# Patient Record
Sex: Female | Born: 1960
Health system: Southern US, Community
[De-identification: ages and names within clinical notes are randomized; demographics above are authoritative.]

## PROBLEM LIST (undated history)

## (undated) DIAGNOSIS — N809 Endometriosis, unspecified: Secondary | ICD-10-CM

## (undated) DIAGNOSIS — R002 Palpitations: Secondary | ICD-10-CM

## (undated) DIAGNOSIS — R42 Dizziness and giddiness: Secondary | ICD-10-CM

## (undated) DIAGNOSIS — J45909 Unspecified asthma, uncomplicated: Secondary | ICD-10-CM

## (undated) DIAGNOSIS — I10 Essential (primary) hypertension: Secondary | ICD-10-CM

## (undated) DIAGNOSIS — F419 Anxiety disorder, unspecified: Secondary | ICD-10-CM

## (undated) DIAGNOSIS — N951 Menopausal and female climacteric states: Secondary | ICD-10-CM

## (undated) DIAGNOSIS — Z87442 Personal history of urinary calculi: Secondary | ICD-10-CM

## (undated) DIAGNOSIS — D219 Benign neoplasm of connective and other soft tissue, unspecified: Secondary | ICD-10-CM

## (undated) HISTORY — DX: Essential (primary) hypertension: I10

## (undated) HISTORY — DX: Endometriosis, unspecified: N80.9

## (undated) HISTORY — DX: Anxiety disorder, unspecified: F41.9

## (undated) HISTORY — DX: Menopausal and female climacteric states: N95.1

## (undated) HISTORY — DX: Palpitations: R00.2

## (undated) HISTORY — DX: Benign neoplasm of connective and other soft tissue, unspecified: D21.9

## (undated) HISTORY — DX: Unspecified asthma, uncomplicated: J45.909

## (undated) HISTORY — PX: PELVIC LAPAROSCOPY: SHX162

---

## 2011-09-22 HISTORY — PX: ABDOMINAL HYSTERECTOMY: SHX81

## 2015-03-06 DIAGNOSIS — H8110 Benign paroxysmal vertigo, unspecified ear: Secondary | ICD-10-CM | POA: Insufficient documentation

## 2015-03-06 DIAGNOSIS — N959 Unspecified menopausal and perimenopausal disorder: Secondary | ICD-10-CM | POA: Insufficient documentation

## 2016-10-14 ENCOUNTER — Other Ambulatory Visit: Payer: Self-pay | Admitting: Internal Medicine

## 2016-10-14 DIAGNOSIS — Z1231 Encounter for screening mammogram for malignant neoplasm of breast: Secondary | ICD-10-CM

## 2016-11-04 ENCOUNTER — Encounter: Payer: Self-pay | Admitting: Women's Health

## 2016-11-04 ENCOUNTER — Ambulatory Visit: Payer: Self-pay

## 2016-11-04 ENCOUNTER — Ambulatory Visit (INDEPENDENT_AMBULATORY_CARE_PROVIDER_SITE_OTHER): Payer: BC Managed Care – PPO | Admitting: Women's Health

## 2016-11-04 VITALS — BP 136/88 | Ht 67.0 in | Wt 239.0 lb

## 2016-11-04 DIAGNOSIS — R1032 Left lower quadrant pain: Secondary | ICD-10-CM | POA: Diagnosis not present

## 2016-11-04 DIAGNOSIS — Z13220 Encounter for screening for lipoid disorders: Secondary | ICD-10-CM

## 2016-11-04 DIAGNOSIS — Z1329 Encounter for screening for other suspected endocrine disorder: Secondary | ICD-10-CM | POA: Diagnosis not present

## 2016-11-04 DIAGNOSIS — E559 Vitamin D deficiency, unspecified: Secondary | ICD-10-CM | POA: Diagnosis not present

## 2016-11-04 DIAGNOSIS — Z01419 Encounter for gynecological examination (general) (routine) without abnormal findings: Secondary | ICD-10-CM | POA: Diagnosis not present

## 2016-11-04 LAB — COMPREHENSIVE METABOLIC PANEL
ALT: 15 U/L (ref 6–29)
AST: 19 U/L (ref 10–35)
Albumin: 4.3 g/dL (ref 3.6–5.1)
Alkaline Phosphatase: 104 U/L (ref 33–130)
BUN: 16 mg/dL (ref 7–25)
CHLORIDE: 106 mmol/L (ref 98–110)
CO2: 26 mmol/L (ref 20–31)
Calcium: 9.9 mg/dL (ref 8.6–10.4)
Creat: 0.6 mg/dL (ref 0.50–1.05)
GLUCOSE: 95 mg/dL (ref 65–99)
POTASSIUM: 4.1 mmol/L (ref 3.5–5.3)
Sodium: 144 mmol/L (ref 135–146)
Total Bilirubin: 0.4 mg/dL (ref 0.2–1.2)
Total Protein: 6.8 g/dL (ref 6.1–8.1)

## 2016-11-04 LAB — CBC WITH DIFFERENTIAL/PLATELET
BASOS PCT: 0 %
Basophils Absolute: 0 cells/uL (ref 0–200)
EOS ABS: 213 {cells}/uL (ref 15–500)
EOS PCT: 3 %
HCT: 38.2 % (ref 35.0–45.0)
Hemoglobin: 12.3 g/dL (ref 11.7–15.5)
LYMPHS PCT: 36 %
Lymphs Abs: 2556 cells/uL (ref 850–3900)
MCH: 29.1 pg (ref 27.0–33.0)
MCHC: 32.2 g/dL (ref 32.0–36.0)
MCV: 90.5 fL (ref 80.0–100.0)
MONOS PCT: 9 %
MPV: 9.3 fL (ref 7.5–12.5)
Monocytes Absolute: 639 cells/uL (ref 200–950)
Neutro Abs: 3692 cells/uL (ref 1500–7800)
Neutrophils Relative %: 52 %
PLATELETS: 279 10*3/uL (ref 140–400)
RBC: 4.22 MIL/uL (ref 3.80–5.10)
RDW: 13.3 % (ref 11.0–15.0)
WBC: 7.1 10*3/uL (ref 3.8–10.8)

## 2016-11-04 LAB — LIPID PANEL
CHOL/HDL RATIO: 3.3 ratio (ref <5.0)
Cholesterol: 184 mg/dL (ref <200)
HDL: 55 mg/dL (ref >50)
LDL Cholesterol: 105 mg/dL — ABNORMAL HIGH (ref <100)
Triglycerides: 118 mg/dL (ref <150)
VLDL: 24 mg/dL (ref <30)

## 2016-11-04 LAB — TSH: TSH: 1.98 m[IU]/L

## 2016-11-04 NOTE — Progress Notes (Signed)
Erin Salinas 1961-03-16 [id number]    History:    Presents for new patient annual exam. Complains of perimenopausal sxs- forgetfulness and LLQ pain. Able to work full time and maintain a home without difficulty. Loses train of thought at times. Denies diaphoresis and mood changes. Has history of endometriosis, had hysterectomy/fibroids and menorrhagia  years ago in [place]. Normal pap history. Last mammogram in 2015. Has mammogram scheduled for March. Never had colonoscopy. Conceived by in vitro.  Past medical history, past surgical history, family history and social history were all reviewed and documented in the EPIC chart. 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 [place]. Son away at [place], daughter 60 at [employer] doing well.  ROS:  A ROS was performed and pertinent positives and negatives are included.  Exam:  Vitals:   11/04/16 1519  BP: 136/88  Weight: 239 lb (108.4 kg)  Height: 5\' 7"  (1.702 m)   Body mass index is 37.43 kg/m.   General appearance:  Normal Thyroid:  Symmetrical, normal in size, without palpable masses or nodularity. Respiratory  Auscultation:  Clear without wheezing or rhonchi Cardiovascular  Auscultation:  Regular rate, without rubs, murmurs or gallops  Edema/varicosities:  Not grossly evident Abdominal  Soft,nontender, without masses, guarding or rebound.  Liver/spleen:  No organomegaly noted  Hernia:  None appreciated  Skin  Inspection:  Grossly normal   Breasts: Examined lying and sitting.     Right: Without masses, retractions, discharge or axillary adenopathy.     Left: Without masses, retractions, discharge or axillary adenopathy. Gentitourinary   Inguinal/mons:  Normal without inguinal adenopathy  External genitalia:  Normal  BUS/Urethra/Skene's glands:  Normal  Vagina:  Normal  Cervix:  Absent  Uterus:  Absent Adnexa/parametria:     Rt: Without masses or  tenderness.   Lt: Without masses or tenderness.  Anus and perineum: Normal  Digital rectal exam: Normal sphincter tone without palpated masses or tenderness  Assessment/Plan:  56 y.o. MWF G3 P2  for annual exam.     Postmenopausal on no HRT  Obesity Borderline blood pressure Intermittent left lower quadrant pain for over a year  Plan: Schedule ultrasound. Menopause and HRT reviewed, declines. Screening colonoscopy, [facility] information plans to schedule in the summer. SBE's, annual screening mammogram keep scheduled appointment. Encouraged increased exercise and decrease calories for weight loss. CBC, CMP, lipid panel, TSH, vitamin D, check blood pressure away from office if continues greater than 130/80 follow-up with primary care.     [place], 5:00 PM 11/04/2016

## 2016-11-04 NOTE — Patient Instructions (Signed)

## 2016-11-05 ENCOUNTER — Other Ambulatory Visit: Payer: Self-pay | Admitting: *Deleted

## 2016-11-05 LAB — VITAMIN D 25 HYDROXY (VIT D DEFICIENCY, FRACTURES): VIT D 25 HYDROXY: 17 ng/mL — AB (ref 30–100)

## 2016-11-05 MED ORDER — VITAMIN D (ERGOCALCIFEROL) 1.25 MG (50000 UNIT) PO CAPS: ORAL_CAPSULE | ORAL | 0 refills | Status: DC

## 2016-11-18 ENCOUNTER — Ambulatory Visit
Admission: RE | Admit: 2016-11-18 | Discharge: 2016-11-18 | Disposition: A | Discharge status: Home / Self Care | Payer: BC Managed Care – PPO | Source: Ambulatory Visit | Attending: Internal Medicine | Admitting: Internal Medicine

## 2016-11-18 DIAGNOSIS — Z1231 Encounter for screening mammogram for malignant neoplasm of breast: Secondary | ICD-10-CM

## 2016-11-19 ENCOUNTER — Other Ambulatory Visit: Payer: Self-pay | Admitting: Women's Health

## 2016-11-19 ENCOUNTER — Encounter: Payer: Self-pay | Admitting: Women's Health

## 2016-11-19 ENCOUNTER — Ambulatory Visit (INDEPENDENT_AMBULATORY_CARE_PROVIDER_SITE_OTHER): Payer: BC Managed Care – PPO

## 2016-11-19 ENCOUNTER — Ambulatory Visit (INDEPENDENT_AMBULATORY_CARE_PROVIDER_SITE_OTHER): Payer: BC Managed Care – PPO | Admitting: Women's Health

## 2016-11-19 DIAGNOSIS — N83202 Unspecified ovarian cyst, left side: Secondary | ICD-10-CM

## 2016-11-19 DIAGNOSIS — R1032 Left lower quadrant pain: Secondary | ICD-10-CM

## 2016-11-19 NOTE — Progress Notes (Signed)
Presents for Ultrasound following complaints of intermittent LLQ pain for over a year. Hysterectomy endometriosis, fibroids and menorrhagia..   Reports no pain today.  Exam: Appears well. Ultrasound- vaginal cuff negative. Right ovary normal., L ovary with thin-walled echo-free cyst, neg CFD 25x20x24 mm and thick-walled collapsed follicle 0000000 mm neg CFD. Fluid in left CDS above OV 7x5 mm.  Left Ovarian cyst Vitamin D deficiency  Plan: Reassurance provided on ovarian cysts being functional, perimenopausal , no intervention necessary at this time. Handout provided on ovarian cysts.    CA 125 pending. Instructed to call if she has any questions or concerns. Reviewed possible endometriosis causing discomfort. Vitamin D level 17 ng/mL, started Vit D Q week 50,000 x 12 weeks then vit D 2000 otc daily. Normal mammogram results reviewed from today.

## 2016-11-19 NOTE — Patient Instructions (Signed)
Ovarian Cyst  An ovarian cyst is a fluid-filled sac that forms on an ovary. The ovaries are small organs that produce eggs in women. Various types of cysts can form on the ovaries. Some may cause symptoms and require treatment. Most ovarian cysts go away on their own, are not cancerous (are benign), and do not cause problems. Common types of ovarian cysts include:  Functional (follicle) cysts.  Occur during the menstrual cycle, and usually go away with the next menstrual cycle if you do not get pregnant.  Usually cause no symptoms.  Endometriomas.  Are cysts that form from the tissue that lines the uterus (endometrium).  Are sometimes called "chocolate cysts" because they become filled with blood that turns brown.  Can cause pain in the lower abdomen during intercourse and during your period.  Cystadenoma cysts.  Develop from cells on the outside surface of the ovary.  Can get very large and cause lower abdomen pain and pain with intercourse.  Can cause severe pain if they twist or break open (rupture).  Dermoid cysts.  Are sometimes found in both ovaries.  May contain different kinds of body tissue, such as skin, teeth, hair, or cartilage.  Usually do not cause symptoms unless they get very big.  Theca lutein cysts.  Occur when too much of a certain hormone (human chorionic gonadotropin) is produced and overstimulates the ovaries to produce an egg.  Are most common after having procedures used to assist with the conception of a baby (in vitro fertilization). What are the causes? Ovarian cysts may be caused by:  Ovarian hyperstimulation syndrome. This is a condition that can develop from taking fertility medicines. It causes multiple large ovarian cysts to form.  Polycystic ovarian syndrome (PCOS). This is a common hormonal disorder that can cause ovarian cysts, as well as problems with your period or fertility. What increases the risk? The following factors may make you  more likely to develop ovarian cysts:  Being overweight or obese.  Taking fertility medicines.  Taking certain forms of hormonal birth control.  Smoking. What are the signs or symptoms? Many ovarian cysts do not cause symptoms. If symptoms are present, they may include:  Pelvic pain or pressure.  Pain in the lower abdomen.  Pain during sex.  Abdominal swelling.  Abnormal menstrual periods.  Increasing pain with menstrual periods. How is this diagnosed? These cysts are commonly found during a routine pelvic exam. You may have tests to find out more about the cyst, such as:  Ultrasound.  X-ray of the pelvis.  CT scan.  MRI.  Blood tests. How is this treated? Many ovarian cysts go away on their own without treatment. Your health care provider may want to check your cyst regularly for 2-3 months to see if it changes. If you are in menopause, it is especially important to have your cyst monitored closely because menopausal women have a higher rate of ovarian cancer. When treatment is needed, it may include:  Medicines to help relieve pain.  A procedure to drain the cyst (aspiration).  Surgery to remove the whole cyst.  Hormone treatment or birth control pills. These methods are sometimes used to help dissolve a cyst. Follow these instructions at home:  Take over-the-counter and prescription medicines only as told by your health care provider.  Do not drive or use heavy machinery while taking prescription pain medicine.  Get regular pelvic exams and Pap tests as often as told by your health care provider.  Return to your   normal activities as told by your health care provider. Ask your health care provider what activities are safe for you.  Do not use any products that contain nicotine or tobacco, such as cigarettes and e-cigarettes. If you need help quitting, ask your health care provider.  Keep all follow-up visits as told by your health care provider. This is  important. Contact a health care provider if:  Your periods are late, irregular, or painful, or they stop.  You have pelvic pain that does not go away.  You have pressure on your bladder or trouble emptying your bladder completely.  You have pain during sex.  You have any of the following in your abdomen:  A feeling of fullness.  Pressure.  Discomfort.  Pain that does not go away.  Swelling.  You feel generally ill.  You become constipated.  You lose your appetite.  You develop severe acne.  You start to have more body hair and facial hair.  You are gaining weight or losing weight without changing your exercise and eating habits.  You think you may be pregnant. Get help right away if:  You have abdominal pain that is severe or gets worse.  You cannot eat or drink without vomiting.  You suddenly develop a fever.  Your menstrual period is much heavier than usual. This information is not intended to replace advice given to you by your health care provider. Make sure you discuss any questions you have with your health care provider. Document Released: 09/07/2005 Document Revised: 03/27/2016 Document Reviewed: 02/09/2016 Elsevier Interactive Patient Education  2017 Elsevier Inc.  

## 2016-11-20 LAB — CA 125: CA 125: 17 U/mL (ref <35)

## 2017-02-03 ENCOUNTER — Encounter: Payer: Self-pay | Admitting: Gynecology

## 2017-10-08 ENCOUNTER — Other Ambulatory Visit: Payer: Self-pay | Admitting: Internal Medicine

## 2017-10-08 DIAGNOSIS — Z1231 Encounter for screening mammogram for malignant neoplasm of breast: Secondary | ICD-10-CM

## 2017-11-19 ENCOUNTER — Ambulatory Visit
Admission: RE | Admit: 2017-11-19 | Discharge: 2017-11-19 | Disposition: A | Discharge status: Home / Self Care | Payer: BC Managed Care – PPO | Source: Ambulatory Visit | Attending: Internal Medicine | Admitting: Internal Medicine

## 2017-11-19 DIAGNOSIS — Z1231 Encounter for screening mammogram for malignant neoplasm of breast: Secondary | ICD-10-CM

## 2018-03-02 ENCOUNTER — Encounter: Payer: BC Managed Care – PPO | Admitting: Women's Health

## 2018-03-21 ENCOUNTER — Encounter: Payer: Self-pay | Admitting: Women's Health

## 2018-03-21 ENCOUNTER — Ambulatory Visit (INDEPENDENT_AMBULATORY_CARE_PROVIDER_SITE_OTHER): Payer: BC Managed Care – PPO | Admitting: Women's Health

## 2018-03-21 VITALS — BP 118/80 | Ht 67.0 in | Wt 212.0 lb

## 2018-03-21 DIAGNOSIS — Z01419 Encounter for gynecological examination (general) (routine) without abnormal findings: Secondary | ICD-10-CM | POA: Diagnosis not present

## 2018-03-21 DIAGNOSIS — Z13220 Encounter for screening for lipoid disorders: Secondary | ICD-10-CM

## 2018-03-21 MED ORDER — ESTRADIOL 10 MCG VA TABS: ORAL_TABLET | VAGINAL | 11 refills | Status: DC

## 2018-03-21 MED ORDER — FLUCONAZOLE 100 MG PO TABS: ORAL_TABLET | ORAL | 0 refills | Status: DC

## 2018-03-21 NOTE — Progress Notes (Signed)
Erin Salinas 1961-07-14 [id number]    History:    Presents for annual exam.  TVH on no HRT for endometriosis and fibroids done greater than 20 years ago in [place].  Normal Pap and mammogram history.  Has not had a screening colonoscopy.  Oldest son conceived by in vitro.  Has lost 25 pounds in the past year with diet and exercise.  Past medical history, past surgical history, family history and social history were all reviewed and documented in the EPIC chart.  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  Daughter 23 senior in high school.  Son recently graduated from college working in [place].  ROS:  A ROS was performed and pertinent positives and negatives are included.  Exam:  Vitals:   03/21/18 1055  BP: 118/80  Weight: 212 lb (96.2 kg)  Height: 5\' 7"  (1.702 m)   Body mass index is 33.2 kg/m.   General appearance:  Normal Thyroid:  Symmetrical, normal in size, without palpable masses or nodularity. Respiratory  Auscultation:  Clear without wheezing or rhonchi Cardiovascular  Auscultation:  Regular rate, without rubs, murmurs or gallops  Edema/varicosities:  Not grossly evident Abdominal  Soft,nontender, without masses, guarding or rebound.  Liver/spleen:  No organomegaly noted  Hernia:  None appreciated  Skin  Inspection:  Grossly normal   Breasts: Examined lying and sitting.     Right: Without masses, retractions, discharge or axillary adenopathy.     Left: Without masses, retractions, discharge or axillary adenopathy. Gentitourinary   Inguinal/mons:  Normal without inguinal adenopathy  External genitalia:  Normal  BUS/Urethra/Skene's glands:  Normal  Vagina: Atrophic  Cervix: And uterus absent  Adnexa/parametria:     Rt: Without masses or tenderness.   Lt: Without masses or tenderness.  Anus and perineum: Normal  Digital rectal exam: Normal sphincter tone without palpated masses or tenderness  Assessment/Plan:  57 y.o. MWF G3, P2 for annual exam  with plan of vaginal dryness and pain with intercourse.  Postmenopausal on no HRT with no bleeding Dyspareunia  Plan: Options reviewed we will try Vagifem 1 applicator at bedtime for 2 weeks and then twice weekly thereafter.  Reviewed minimal systemic absorption.  Vaginal lubricants, call if no relief of symptoms.  SBE's, continue annual screening mammogram, calcium rich foods, vitamin D 2000 daily encouraged.  Screening colonoscopy reviewed, our GI information given instructed to schedule.  CBC, lipid panel, CMP,    [place], 1:43 PM 03/21/2018

## 2018-03-21 NOTE — Patient Instructions (Addendum)
[facility]  [phone]  Dr   Jearl Klinefelter D 2000 iu   Health Maintenance for Postmenopausal Women Menopause is a normal process in which your reproductive ability comes to an end. This process happens gradually over a span of months to years, usually between the ages of 55 and 64. Menopause is complete when you have missed 12 consecutive menstrual periods. It is important to talk with your health care provider about some of the most common conditions that affect postmenopausal women, such as heart disease, cancer, and bone loss (osteoporosis). Adopting a healthy lifestyle and getting preventive care can help to promote your health and wellness. Those actions can also lower your chances of developing some of these common conditions. What should I know about menopause? During menopause, you may experience a number of symptoms, such as:  Moderate-to-severe hot flashes.  Night sweats.  Decrease in sex drive.  Mood swings.  Headaches.  Tiredness.  Irritability.  Memory problems.  Insomnia.  Choosing to treat or not to treat menopausal changes is an individual decision that you make with your health care provider. What should I know about hormone replacement therapy and supplements? Hormone therapy products are effective for treating symptoms that are associated with menopause, such as hot flashes and night sweats. Hormone replacement carries certain risks, especially as you become older. If you are thinking about using estrogen or estrogen with progestin treatments, discuss the benefits and risks with your health care provider. What should I know about heart disease and stroke? Heart disease, heart attack, and stroke become more likely as you age. This may be due, in part, to the hormonal changes that your body experiences during menopause. These can affect how your body processes dietary fats, triglycerides, and cholesterol. Heart attack and stroke are both medical emergencies. There are many  things that you can do to help prevent heart disease and stroke:  Have your blood pressure checked at least every 1-2 years. High blood pressure causes heart disease and increases the risk of stroke.  If you are 24-75 years old, ask your health care provider if you should take aspirin to prevent a heart attack or a stroke.  Do not use any tobacco products, including cigarettes, chewing tobacco, or electronic cigarettes. If you need help quitting, ask your health care provider.  It is important to eat a healthy diet and maintain a healthy weight. ? Be sure to include plenty of vegetables, fruits, low-fat dairy products, and lean protein. ? Avoid eating foods that are high in solid fats, added sugars, or salt (sodium).  Get regular exercise. This is one of the most important things that you can do for your health. ? Try to exercise for at least 150 minutes each week. The type of exercise that you do should increase your heart rate and make you sweat. This is known as moderate-intensity exercise. ? Try to do strengthening exercises at least twice each week. Do these in addition to the moderate-intensity exercise.  Know your numbers.Ask your health care provider to check your cholesterol and your blood glucose. Continue to have your blood tested as directed by your health care provider.  What should I know about cancer screening? There are several types of cancer. Take the following steps to reduce your risk and to catch any cancer development as early as possible. Breast Cancer  Practice breast self-awareness. ? This means understanding how your breasts normally appear and feel. ? It also means doing regular breast self-exams. Let your health care  provider know about any changes, no matter how small.  If you are 59 or older, have a clinician do a breast exam (clinical breast exam or CBE) every year. Depending on your age, family history, and medical history, it may be recommended that you  also have a yearly breast X-ray (mammogram).  If you have a family history of breast cancer, talk with your health care provider about genetic screening.  If you are at high risk for breast cancer, talk with your health care provider about having an MRI and a mammogram every year.  Breast cancer (BRCA) gene test is recommended for women who have family members with BRCA-related cancers. Results of the assessment will determine the need for genetic counseling and BRCA1 and for BRCA2 testing. BRCA-related cancers include these types: ? Breast. This occurs in males or females. ? Ovarian. ? Tubal. This may also be called fallopian tube cancer. ? Cancer of the abdominal or pelvic lining (peritoneal cancer). ? Prostate. ? Pancreatic.  Cervical, Uterine, and Ovarian Cancer Your health care provider may recommend that you be screened regularly for cancer of the pelvic organs. These include your ovaries, uterus, and vagina. This screening involves a pelvic exam, which includes checking for microscopic changes to the surface of your cervix (Pap test).  For women ages 21-65, health care providers may recommend a pelvic exam and a Pap test every three years. For women ages 27-65, they may recommend the Pap test and pelvic exam, combined with testing for human papilloma virus (HPV), every five years. Some types of HPV increase your risk of cervical cancer. Testing for HPV may also be done on women of any age who have unclear Pap test results.  Other health care providers may not recommend any screening for nonpregnant women who are considered low risk for pelvic cancer and have no symptoms. Ask your health care provider if a screening pelvic exam is right for you.  If you have had past treatment for cervical cancer or a condition that could lead to cancer, you need Pap tests and screening for cancer for at least 20 years after your treatment. If Pap tests have been discontinued for you, your risk factors  (such as having a new sexual partner) need to be reassessed to determine if you should start having screenings again. Some women have medical problems that increase the chance of getting cervical cancer. In these cases, your health care provider may recommend that you have screening and Pap tests more often.  If you have a family history of uterine cancer or ovarian cancer, talk with your health care provider about genetic screening.  If you have vaginal bleeding after reaching menopause, tell your health care provider.  There are currently no reliable tests available to screen for ovarian cancer.  Lung Cancer Lung cancer screening is recommended for adults 6-80 years old who are at high risk for lung cancer because of a history of smoking. A yearly low-dose CT scan of the lungs is recommended if you:  Currently smoke.  Have a history of at least 30 pack-years of smoking and you currently smoke or have quit within the past 15 years. A pack-year is smoking an average of one pack of cigarettes per day for one year.  Yearly screening should:  Continue until it has been 15 years since you quit.  Stop if you develop a health problem that would prevent you from having lung cancer treatment.  Colorectal Cancer  This type of cancer can be  detected and can often be prevented.  Routine colorectal cancer screening usually begins at age 14 and continues through age 34.  If you have risk factors for colon cancer, your health care provider may recommend that you be screened at an earlier age.  If you have a family history of colorectal cancer, talk with your health care provider about genetic screening.  Your health care provider may also recommend using home test kits to check for hidden blood in your stool.  A small camera at the end of a tube can be used to examine your colon directly (sigmoidoscopy or colonoscopy). This is done to check for the earliest forms of colorectal cancer.  Direct  examination of the colon should be repeated every 5-10 years until age 49. However, if early forms of precancerous polyps or small growths are found or if you have a family history or genetic risk for colorectal cancer, you may need to be screened more often.  Skin Cancer  Check your skin from head to toe regularly.  Monitor any moles. Be sure to tell your health care provider: ? About any new moles or changes in moles, especially if there is a change in a mole's shape or color. ? If you have a mole that is larger than the size of a pencil eraser.  If any of your family members has a history of skin cancer, especially at a young age, talk with your health care provider about genetic screening.  Always use sunscreen. Apply sunscreen liberally and repeatedly throughout the day.  Whenever you are outside, protect yourself by wearing long sleeves, pants, a wide-brimmed hat, and sunglasses.  What should I know about osteoporosis? Osteoporosis is a condition in which bone destruction happens more quickly than new bone creation. After menopause, you may be at an increased risk for osteoporosis. To help prevent osteoporosis or the bone fractures that can happen because of osteoporosis, the following is recommended:  If you are 14-82 years old, get at least 1,000 mg of calcium and at least 600 mg of vitamin D per day.  If you are older than age 21 but younger than age 38, get at least 1,200 mg of calcium and at least 600 mg of vitamin D per day.  If you are older than age 82, get at least 1,200 mg of calcium and at least 800 mg of vitamin D per day.  Smoking and excessive alcohol intake increase the risk of osteoporosis. Eat foods that are rich in calcium and vitamin D, and do weight-bearing exercises several times each week as directed by your health care provider. What should I know about how menopause affects my mental health? Depression may occur at any age, but it is more common as you become  older. Common symptoms of depression include:  Low or sad mood.  Changes in sleep patterns.  Changes in appetite or eating patterns.  Feeling an overall lack of motivation or enjoyment of activities that you previously enjoyed.  Frequent crying spells.  Talk with your health care provider if you think that you are experiencing depression. What should I know about immunizations? It is important that you get and maintain your immunizations. These include:  Tetanus, diphtheria, and pertussis (Tdap) booster vaccine.  Influenza every year before the flu season begins.  Pneumonia vaccine.  Shingles vaccine.  Your health care provider may also recommend other immunizations. This information is not intended to replace advice given to you by your health care provider. Make sure  you discuss any questions you have with your health care provider. Document Released: 10/30/2005 Document Revised: 03/27/2016 Document Reviewed: 06/11/2015 Elsevier Interactive Patient Education  2018 [employer].

## 2018-03-23 ENCOUNTER — Telehealth: Payer: Self-pay

## 2018-03-23 ENCOUNTER — Other Ambulatory Visit: Payer: Self-pay

## 2018-03-23 NOTE — Telephone Encounter (Signed)
I could have arranged that but thanks!!

## 2018-03-23 NOTE — Telephone Encounter (Signed)
Faythe Ghee, I asked Nichole to add vitamin D

## 2018-03-23 NOTE — Telephone Encounter (Signed)
-----   Message from Huel Cote, NP sent at 03/22/2018 10:13 AM EDT ----- Please call and review blood sugar, electrolytes and liver enzymes all normal.  Cholesterol elevated, less than 20 g saturated fat daily, increase regular cardio type exercise 30 minutes most days of the week would be great.  Continue weight loss, has lost about 20 pounds in the past year.  Red rice yeast supplement daily.  Good cholesterol HDL and triglycerides are both good.

## 2018-03-23 NOTE — Telephone Encounter (Signed)
I discussed results with patient and she asked about her Vitamin D level. I told her that was not done. She said she was deficient last year when you checked in and she would like to know about that. Ok to see if we can add to current blood specimen or have her return if not?

## 2018-03-25 LAB — TEST AUTHORIZATION

## 2018-03-25 LAB — COMPREHENSIVE METABOLIC PANEL
AG Ratio: 2 (calc) (ref 1.0–2.5)
ALBUMIN MSPROF: 4.7 g/dL (ref 3.6–5.1)
ALKALINE PHOSPHATASE (APISO): 118 U/L (ref 33–130)
ALT: 18 U/L (ref 6–29)
AST: 26 U/L (ref 10–35)
BILIRUBIN TOTAL: 0.6 mg/dL (ref 0.2–1.2)
BUN: 13 mg/dL (ref 7–25)
CALCIUM: 9.8 mg/dL (ref 8.6–10.4)
CO2: 19 mmol/L — ABNORMAL LOW (ref 20–32)
Chloride: 104 mmol/L (ref 98–110)
Creat: 0.7 mg/dL (ref 0.50–1.05)
Globulin: 2.4 g/dL (calc) (ref 1.9–3.7)
Glucose, Bld: 91 mg/dL (ref 65–99)
POTASSIUM: 4.7 mmol/L (ref 3.5–5.3)
Sodium: 140 mmol/L (ref 135–146)
Total Protein: 7.1 g/dL (ref 6.1–8.1)

## 2018-03-25 LAB — LIPID PANEL
Cholesterol: 216 mg/dL — ABNORMAL HIGH (ref <200)
HDL: 66 mg/dL (ref >50)
LDL CHOLESTEROL (CALC): 134 mg/dL — AB
NON-HDL CHOLESTEROL (CALC): 150 mg/dL — AB (ref <130)
Total CHOL/HDL Ratio: 3.3 (calc) (ref <5.0)
Triglycerides: 72 mg/dL (ref <150)

## 2018-03-25 LAB — VITAMIN D 25 HYDROXY (VIT D DEFICIENCY, FRACTURES): VIT D 25 HYDROXY: 39 ng/mL (ref 30–100)

## 2018-06-20 ENCOUNTER — Telehealth: Payer: Self-pay | Admitting: *Deleted

## 2018-06-20 DIAGNOSIS — Z1329 Encounter for screening for other suspected endocrine disorder: Secondary | ICD-10-CM

## 2018-06-20 NOTE — Telephone Encounter (Signed)
Patient called and left message c/o hair loss, asked if vitamin d level should be rechecked, history of abnormal  vitamin d in past. Please advise

## 2018-06-20 NOTE — Telephone Encounter (Signed)
Please call and review vitamin D level on last check was normal but make sure she is still taking over-the-counter 2000 IUs daily of vitamin D.  TSH was normal in 2018 we could recheck that.  She could take an over-the-counter vitamin for hair and nails. Ask  if she noticing only hair loss with hair brushing and shampooing?  Any hair loss on her pillow?  Some hair loss is quite normal especially as we age.

## 2018-06-21 NOTE — Telephone Encounter (Signed)
Left message for patient to call.

## 2018-06-21 NOTE — Telephone Encounter (Signed)
Patient said she was not taking vitamin d 2,000 units, will start taking, main hair loss with shampooing, no hair located on pillow. She come have lab test drawn on 06/29/18 @ 9:00am

## 2018-06-29 ENCOUNTER — Other Ambulatory Visit: Payer: Self-pay

## 2018-08-19 ENCOUNTER — Emergency Department (HOSPITAL_BASED_OUTPATIENT_CLINIC_OR_DEPARTMENT_OTHER): Payer: BC Managed Care – PPO

## 2018-08-19 ENCOUNTER — Encounter (HOSPITAL_BASED_OUTPATIENT_CLINIC_OR_DEPARTMENT_OTHER): Payer: Self-pay | Admitting: Emergency Medicine

## 2018-08-19 ENCOUNTER — Other Ambulatory Visit: Payer: Self-pay

## 2018-08-19 ENCOUNTER — Emergency Department (HOSPITAL_BASED_OUTPATIENT_CLINIC_OR_DEPARTMENT_OTHER)
Admission: EM | Admit: 2018-08-19 | Discharge: 2018-08-19 | Disposition: A | Discharge status: Home / Self Care | Payer: BC Managed Care – PPO | Attending: Emergency Medicine | Admitting: Emergency Medicine

## 2018-08-19 DIAGNOSIS — N130 Hydronephrosis with ureteropelvic junction obstruction: Secondary | ICD-10-CM | POA: Insufficient documentation

## 2018-08-19 DIAGNOSIS — N200 Calculus of kidney: Secondary | ICD-10-CM

## 2018-08-19 DIAGNOSIS — R1032 Left lower quadrant pain: Secondary | ICD-10-CM | POA: Diagnosis present

## 2018-08-19 LAB — COMPREHENSIVE METABOLIC PANEL
ALK PHOS: 86 U/L (ref 38–126)
ALT: 16 U/L (ref 0–44)
ANION GAP: 9 (ref 5–15)
AST: 26 U/L (ref 15–41)
Albumin: 4.2 g/dL (ref 3.5–5.0)
BUN: 19 mg/dL (ref 6–20)
CO2: 27 mmol/L (ref 22–32)
Calcium: 9.4 mg/dL (ref 8.9–10.3)
Chloride: 102 mmol/L (ref 98–111)
Creatinine, Ser: 0.97 mg/dL (ref 0.44–1.00)
GFR calc Af Amer: >60 mL/min (ref >60)
GFR calc non Af Amer: >60 mL/min (ref >60)
GLUCOSE: 94 mg/dL (ref 70–99)
Potassium: 3.3 mmol/L — ABNORMAL LOW (ref 3.5–5.1)
SODIUM: 138 mmol/L (ref 135–145)
Total Bilirubin: 0.8 mg/dL (ref 0.3–1.2)
Total Protein: 7.4 g/dL (ref 6.5–8.1)

## 2018-08-19 LAB — URINALYSIS, ROUTINE W REFLEX MICROSCOPIC
Bilirubin Urine: NEGATIVE
GLUCOSE, UA: NEGATIVE mg/dL
Ketones, ur: NEGATIVE mg/dL
Leukocytes, UA: NEGATIVE
Nitrite: NEGATIVE
PROTEIN: NEGATIVE mg/dL
Specific Gravity, Urine: <1.005 — ABNORMAL LOW (ref 1.005–1.030)
pH: 6 (ref 5.0–8.0)

## 2018-08-19 LAB — CBC
HCT: 39.3 % (ref 36.0–46.0)
Hemoglobin: 12.8 g/dL (ref 12.0–15.0)
MCH: 30.5 pg (ref 26.0–34.0)
MCHC: 32.6 g/dL (ref 30.0–36.0)
MCV: 93.6 fL (ref 80.0–100.0)
Platelets: 242 10*3/uL (ref 150–400)
RBC: 4.2 MIL/uL (ref 3.87–5.11)
RDW: 12.5 % (ref 11.5–15.5)
WBC: 10.7 10*3/uL — ABNORMAL HIGH (ref 4.0–10.5)
nRBC: 0 % (ref 0.0–0.2)

## 2018-08-19 LAB — URINALYSIS, MICROSCOPIC (REFLEX)

## 2018-08-19 LAB — LIPASE, BLOOD: Lipase: 36 U/L (ref 11–51)

## 2018-08-19 MED ORDER — OXYCODONE-ACETAMINOPHEN 5-325 MG PO TABS: 1.0000 | ORAL_TABLET | Freq: Four times a day (QID) | ORAL | 0 refills | Status: DC | PRN

## 2018-08-19 MED ORDER — KETOROLAC TROMETHAMINE 10 MG PO TABS: 10.0000 mg | ORAL_TABLET | Freq: Four times a day (QID) | ORAL | 0 refills | Status: DC | PRN

## 2018-08-19 MED ORDER — TAMSULOSIN HCL 0.4 MG PO CAPS: 0.4000 mg | ORAL_CAPSULE | Freq: Every day | ORAL | 0 refills | Status: AC

## 2018-08-19 MED ORDER — ONDANSETRON HCL 4 MG/2ML IJ SOLN
4.0000 mg | Freq: Once | INTRAMUSCULAR | Status: AC
  Administered 2018-08-19: 4 mg via INTRAVENOUS
  Filled 2018-08-19: qty 2

## 2018-08-19 MED ORDER — KETOROLAC TROMETHAMINE 30 MG/ML IJ SOLN
30.0000 mg | Freq: Once | INTRAMUSCULAR | Status: AC
  Administered 2018-08-19: 30 mg via INTRAVENOUS
  Filled 2018-08-19: qty 1

## 2018-08-19 MED ORDER — ONDANSETRON 4 MG PO TBDP: 4.0000 mg | ORAL_TABLET | Freq: Three times a day (TID) | ORAL | 0 refills | Status: DC | PRN

## 2018-08-19 MED FILL — OXYCODONE-ACETAMINOPHEN 5-3: 5-325 | 3 days supply | Qty: 12 | Fill #0

## 2018-08-19 MED FILL — ONDANSETRON ODT 4 MG TABLET: 4 | 6 days supply | Qty: 18 | Fill #0

## 2018-08-19 MED FILL — TAMSULOSIN HCL 0.4 MG CAP: 0.4 | 7 days supply | Qty: 7 | Fill #0

## 2018-08-19 MED FILL — KETOROLAC 10 MG TABLET: 10 | 5 days supply | Qty: 20 | Fill #0

## 2018-08-19 NOTE — Discharge Instructions (Signed)
You have been seen in the Emergency Department (ED) today for pain that we believe based on your workup, is caused by kidney stones.  As we have discussed, please drink plenty of fluids.  Please make a follow up appointment with the physician(s) listed elsewhere in this documentation.  You may take pain medication as needed but ONLY as prescribed.  Please also take your prescribed Flomax daily.  We also recommend that you take over-the-counter ibuprofen regularly according to label instructions over the next 5 days.  Take it with meals to minimize stomach discomfort.  Please call the Urologist listed for an appointment on Monday.   Do not drink alcohol, drive or participate in any other potentially dangerous activities while taking opiate pain medication as it may make you sleepy. Do not take this medication with any other sedating medications, either prescription or over-the-counter. If you were prescribed Percocet or Vicodin, do not take these with acetaminophen (Tylenol) as it is already contained within these medications.   Take Percocet as needed for severe pain.  This medication is an opiate (or narcotic) pain medication and can be habit forming.  Use it as little as possible to achieve adequate pain control.  Do not use or use it with extreme caution if you have a history of opiate abuse or dependence.  If you are on a pain contract with your primary care doctor or a pain specialist, be sure to let them know you were prescribed this medication today from the Emergency Department.  This medication is intended for your use only - do not give any to anyone else and keep it in a secure place where nobody else, especially children, have access to it.  It will also cause or worsen constipation, so you may want to consider taking an over-the-counter stool softener while you are taking this medication.  Return to the Emergency Department (ED) or call your doctor if you have any worsening pain, fever,  painful urination, are unable to urinate, or develop other symptoms that concern you.    Kidney Stones Kidney stones (urolithiasis) are deposits that form inside your kidneys. The intense pain is caused by the stone moving through the urinary tract. When the stone moves, the ureter goes into spasm around the stone. The stone is usually passed in the urine.  CAUSES  A disorder that makes certain neck glands produce too much parathyroid hormone (primary hyperparathyroidism). A buildup of uric acid crystals, similar to gout in your joints. Narrowing (stricture) of the ureter. A kidney obstruction present at birth (congenital obstruction). Previous surgery on the kidney or ureters. Numerous kidney infections. SYMPTOMS  Feeling sick to your stomach (nauseous). Throwing up (vomiting). Blood in the urine (hematuria). Pain that usually spreads (radiates) to the groin. Frequency or urgency of urination. DIAGNOSIS  Taking a history and physical exam. Blood or urine tests. CT scan. Occasionally, an examination of the inside of the urinary bladder (cystoscopy) is performed. TREATMENT  Observation. Increasing your fluid intake. Extracorporeal shock wave lithotripsy--This is a noninvasive procedure that uses shock waves to break up kidney stones. Surgery may be needed if you have severe pain or persistent obstruction. There are various surgical procedures. Most of the procedures are performed with the use of small instruments. Only small incisions are needed to accommodate these instruments, so recovery time is minimized. The size, location, and chemical composition are all important variables that will determine the proper choice of action for you. Talk to your health care provider to better  understand your situation so that you will minimize the risk of injury to yourself and your kidney.  HOME CARE INSTRUCTIONS  Drink enough water and fluids to keep your urine clear or pale yellow. This will help  you to pass the stone or stone fragments. Strain all urine through the provided strainer. Keep all particulate matter and stones for your health care provider to see. The stone causing the pain may be as small as a grain of salt. It is very important to use the strainer each and every time you pass your urine. The collection of your stone will allow your health care provider to analyze it and verify that a stone has actually passed. The stone analysis will often identify what you can do to reduce the incidence of recurrences. Only take over-the-counter or prescription medicines for pain, discomfort, or fever as directed by your health care provider. Keep all follow-up visits as told by your health care provider. This is important. Get follow-up X-rays if required. The absence of pain does not always mean that the stone has passed. It may have only stopped moving. If the urine remains completely obstructed, it can cause loss of kidney function or even complete destruction of the kidney. It is your responsibility to make sure X-rays and follow-ups are completed. Ultrasounds of the kidney can show blockages and the status of the kidney. Ultrasounds are not associated with any radiation and can be performed easily in a matter of minutes. Make changes to your daily diet as told by your health care provider. You may be told to: Limit the amount of salt that you eat. Eat 5 or more servings of fruits and vegetables each day. Limit the amount of meat, poultry, fish, and eggs that you eat. Collect a 24-hour urine sample as told by your health care provider. You may need to collect another urine sample every 6-12 months. SEEK MEDICAL CARE IF: You experience pain that is progressive and unresponsive to any pain medicine you have been prescribed. SEEK IMMEDIATE MEDICAL CARE IF:  Pain cannot be controlled with the prescribed medicine. You have a fever or shaking chills. The severity or intensity of pain increases  over 18 hours and is not relieved by pain medicine. You develop a new onset of abdominal pain. You feel faint or pass out. You are unable to urinate.   This information is not intended to replace advice given to you by your health care provider. Make sure you discuss any questions you have with your health care provider.   Document Released: 09/07/2005 Document Revised: 05/29/2015 Document Reviewed: 02/08/2013 Elsevier Interactive Patient Education [employer].

## 2018-08-19 NOTE — ED Triage Notes (Signed)
LLQ abd pain with vomiting since yesterday.

## 2018-08-19 NOTE — ED Provider Notes (Signed)
Emergency Department Provider Note   I have reviewed the triage vital signs and the nursing notes.   HISTORY  Chief Complaint Abdominal Pain   HPI Erin Salinas is a 57 y.o. female with PMH of anxiety presents to the emergency department for evaluation of left lower quadrant abdominal pain with new onset vomiting.  Patient describes symptoms occurring occasionally over the summer but have worsened significantly over the past week.  She has had intermittent, moderately severe pain in the left flank and lower quadrant.  Last night, patient developed vomiting with abdominal pain and had multiple episodes of emesis overnight.  No diarrhea.  No fevers or chills.  Denies any vaginal bleeding or discharge.  Patient has past surgical history of 2 cesarean sections and a partial hysterectomy.  Denies any dysuria, hesitancy, urgency.  No radiation of symptoms or other modifying factors.   Past Medical History:  Diagnosis Date  . Anxiety   . Endometriosis   . Fibroid     There are no active problems to display for this patient.   Past Surgical History:  Procedure Laterality Date  . ABDOMINAL HYSTERECTOMY  2013   ABDOMINAL HYSTERECTOMY WITH OVARIAN PRESERVATION   . CESAREAN SECTION     x2  . PELVIC LAPAROSCOPY      Allergies Codeine  Family History  Problem Relation Age of Onset  . Cancer Mother        lung- smoker  . Cancer Father        kidney   . Hypertension Father     Social History Social History   Tobacco Use  . Smoking status: Never Smoker  . Smokeless tobacco: Never Used  Substance Use Topics  . Alcohol use: Yes    Comment: OCC  . Drug use: Not Currently    Review of Systems  Constitutional: No fever/chills Eyes: No visual changes. ENT: No sore throat. Cardiovascular: Denies chest pain. Respiratory: Denies shortness of breath. Gastrointestinal: Positive LLQ abdominal pain. Positive nausea and vomiting.  No diarrhea.  No constipation. Genitourinary:  Negative for dysuria. Musculoskeletal: Negative for back pain. Skin: Negative for rash. Neurological: Negative for headaches, focal weakness or numbness.  10-point ROS otherwise negative.  ____________________________________________   PHYSICAL EXAM:  VITAL SIGNS: ED Triage Vitals  Enc Vitals Group     BP 08/19/18 1142 (!) 152/80     Pulse Rate 08/19/18 1142 72     Resp 08/19/18 1142 16     Temp 08/19/18 1142 98.2 F (36.8 C)     Temp Source 08/19/18 1142 Oral     SpO2 08/19/18 1142 99 %     Weight 08/19/18 1141 189 lb (85.7 kg)     Height 08/19/18 1141 5\' 7"  (1.702 m)     Pain Score 08/19/18 1141 3   Constitutional: Alert and oriented. Well appearing and in no acute distress. Eyes: Conjunctivae are normal.  Head: Atraumatic. Nose: No congestion/rhinnorhea. Mouth/Throat: Mucous membranes are moist.   Neck: No stridor.   Cardiovascular: Normal rate, regular rhythm. Good peripheral circulation. Grossly normal heart sounds.   Respiratory: Normal respiratory effort.  No retractions. Lungs CTAB. Gastrointestinal: Soft and nontender. No distention. Mild left CVA tenderness.  Musculoskeletal: No lower extremity tenderness nor edema. No gross deformities of extremities. Neurologic:  Normal speech and language. No gross focal neurologic deficits are appreciated.  Skin:  Skin is warm, dry and intact. No rash noted.  ____________________________________________   LABS (all labs ordered are listed, but only abnormal results are  displayed)  Labs Reviewed  COMPREHENSIVE METABOLIC PANEL - Abnormal; Notable for the following components:      Result Value   Potassium 3.3 (*)    All other components within normal limits  CBC - Abnormal; Notable for the following components:   WBC 10.7 (*)    All other components within normal limits  URINALYSIS, ROUTINE W REFLEX MICROSCOPIC - Abnormal; Notable for the following components:   Color, Urine STRAW (*)    Specific Gravity, Urine <1.005  (*)    Hgb urine dipstick MODERATE (*)    All other components within normal limits  URINALYSIS, MICROSCOPIC (REFLEX) - Abnormal; Notable for the following components:   Bacteria, UA FEW (*)    All other components within normal limits  LIPASE, BLOOD   ____________________________________________  RADIOLOGY  Ct Renal Stone Study  Result Date: 08/19/2018 CLINICAL DATA:  Patient with left flank pain for 1 week.  Vomiting. EXAM: CT ABDOMEN AND PELVIS WITHOUT CONTRAST TECHNIQUE: Multidetector CT imaging of the abdomen and pelvis was performed following the standard protocol without IV contrast. COMPARISON:  None. FINDINGS: Lower chest: Normal heart size. Dependent atelectasis within the bilateral lower lobes. No pleural effusion. Hepatobiliary: Liver is normal in size and contour. Focal nodularity lung the gallbladder fundus nonspecific however favored represent adenomyomatosis. Pancreas: Unremarkable Spleen: Unremarkable Adrenals/Urinary Tract: Left kidney is enlarged and edematous in appearance. There is moderate left hydronephrosis secondary to a 1.1 cm obstructing stone within the proximal left ureter/UVJ (image 32; series 2). Left perinephric fat stranding. Urinary bladder is unremarkable. No right-sided nephrolithiasis. There are multiple 2-3 mm stones within the inferior pole of the left kidney. Stomach/Bowel: No abnormal bowel wall thickening or evidence for bowel obstruction. No free fluid or free intraperitoneal air. Normal morphology of the stomach. Vascular/Lymphatic: None normal caliber abdominal aorta. No retroperitoneal lymphadenopathy. Reproductive: Unremarkable Other: None. Musculoskeletal: Lumbar spine degenerative changes. No aggressive or acute appearing osseous lesions. IMPRESSION: 1. There is a 1.1 cm obstructing stone at the left UVJ resulting in moderate left hydronephrosis, enlargement of the left kidney and perinephric fat stranding. 2. Additional 2-3 mm stones inferior pole left  kidney. 3. These results will be called to the ordering clinician or representative by the Radiologist Assistant, and communication documented in the PACS or zVision Dashboard. Electronically Signed   By: Lovey Newcomer M.D.   On: 08/19/2018 12:19    ____________________________________________   PROCEDURES  Procedure(s) performed:   Procedures  None ____________________________________________   INITIAL IMPRESSION / ASSESSMENT AND PLAN / ED COURSE  Pertinent labs & imaging results that were available during my care of the patient were reviewed by me and considered in my medical decision making (see chart for details).   Patient presents to the emergency department with left lower quadrant abdominal pain.  Unclear etiology based on history and physical.  She does not have significant tenderness of the left lower quadrant on exam.  Describes a more indolent course with significant worsening over the past week with new onset nausea and vomiting.  Abdomen is non-peritoneal.  Plan for CT renal, labs, IV fluids, and Toradol.   Spoke with Urology regarding the CT results. Advise pain control and call the office on Monday for an appointment. Labs reviewed. Pain well controlled here in the ED. Plan for ED return if symptoms worsen of fever/chills develop.   At this time, I do not feel there is any life-threatening condition present. I have reviewed and discussed all results (EKG, imaging, lab, urine as  appropriate), exam findings with patient. I have reviewed nursing notes and appropriate previous records.  I feel the patient is safe to be discharged home without further emergent workup. Discussed usual and customary return precautions. Patient and family (if present) verbalize understanding and are comfortable with this plan.  Patient will follow-up with their primary care provider. If they do not have a primary care provider, information for follow-up has been provided to them. All questions have  been answered.  ____________________________________________  FINAL CLINICAL IMPRESSION(S) / ED DIAGNOSES  Final diagnoses:  Kidney stone     MEDICATIONS GIVEN DURING THIS VISIT:  Medications  ketorolac (TORADOL) 30 MG/ML injection 30 mg (30 mg Intravenous Given 08/19/18 1218)  ondansetron (ZOFRAN) injection 4 mg (4 mg Intravenous Given 08/19/18 1218)     NEW OUTPATIENT MEDICATIONS STARTED DURING THIS VISIT:  Discharge Medication List as of 08/19/2018  1:46 PM    START taking these medications   Details  ketorolac (TORADOL) 10 MG tablet Take 1 tablet (10 mg total) by mouth every 6 (six) hours as needed for moderate pain., Starting Fri 08/19/2018, Print    ondansetron (ZOFRAN ODT) 4 MG disintegrating tablet Take 1 tablet (4 mg total) by mouth every 8 (eight) hours as needed for nausea., Starting Fri 08/19/2018, Print    oxyCODONE-acetaminophen (PERCOCET/ROXICET) 5-325 MG tablet Take 1 tablet by mouth every 6 (six) hours as needed for severe pain., Starting Fri 08/19/2018, Print    tamsulosin (FLOMAX) 0.4 MG CAPS capsule Take 1 capsule (0.4 mg total) by mouth daily for 7 days., Starting Fri 08/19/2018, Until Fri 08/26/2018, Print        Note:  This document was prepared using Dragon voice recognition software and may include unintentional dictation errors.  Nanda Quinton, MD Emergency Medicine    Jakhia Buxton, [facility], MD 08/20/18 [phone]

## 2018-08-22 ENCOUNTER — Encounter (HOSPITAL_COMMUNITY)
Admission: AD | Disposition: A | Discharge status: Home / Self Care | Payer: Self-pay | Source: Ambulatory Visit | Attending: Urology

## 2018-08-22 ENCOUNTER — Other Ambulatory Visit: Payer: Self-pay

## 2018-08-22 ENCOUNTER — Ambulatory Visit (HOSPITAL_COMMUNITY): Payer: BC Managed Care – PPO

## 2018-08-22 ENCOUNTER — Encounter (HOSPITAL_COMMUNITY): Payer: Self-pay | Admitting: *Deleted

## 2018-08-22 ENCOUNTER — Ambulatory Visit (HOSPITAL_COMMUNITY)
Admission: AD | Admit: 2018-08-22 | Discharge: 2018-08-22 | Disposition: A | Discharge status: Home / Self Care | Payer: BC Managed Care – PPO | Source: Ambulatory Visit | Attending: Urology | Admitting: Urology

## 2018-08-22 ENCOUNTER — Other Ambulatory Visit: Payer: Self-pay | Admitting: Urology

## 2018-08-22 ENCOUNTER — Encounter (HOSPITAL_COMMUNITY): Payer: Self-pay | Admitting: Anesthesiology

## 2018-08-22 DIAGNOSIS — N200 Calculus of kidney: Secondary | ICD-10-CM | POA: Insufficient documentation

## 2018-08-22 DIAGNOSIS — N201 Calculus of ureter: Secondary | ICD-10-CM

## 2018-08-22 DIAGNOSIS — Z5309 Procedure and treatment not carried out because of other contraindication: Secondary | ICD-10-CM | POA: Diagnosis not present

## 2018-08-22 HISTORY — PX: EXTRACORPOREAL SHOCK WAVE LITHOTRIPSY: SHX1557

## 2018-08-22 HISTORY — DX: Personal history of urinary calculi: Z87.442

## 2018-08-22 SURGERY — LITHOTRIPSY, ESWL
Anesthesia: LOCAL | Laterality: Left

## 2018-08-22 MED ORDER — DIPHENHYDRAMINE HCL 25 MG PO CAPS
25.0000 mg | ORAL_CAPSULE | ORAL | Status: AC
  Administered 2018-08-22: 25 mg via ORAL
  Filled 2018-08-22: qty 1

## 2018-08-22 MED ORDER — DIAZEPAM 5 MG PO TABS
10.0000 mg | ORAL_TABLET | ORAL | Status: AC
  Administered 2018-08-22: 10 mg via ORAL
  Filled 2018-08-22: qty 2

## 2018-08-22 MED ORDER — CIPROFLOXACIN IN D5W 400 MG/200ML IV SOLN
400.0000 mg | INTRAVENOUS | Status: AC
  Administered 2018-08-22: 400 mg via INTRAVENOUS
  Filled 2018-08-22: qty 200

## 2018-08-22 MED ORDER — SODIUM CHLORIDE 0.9 % IV SOLN
INTRAVENOUS | Status: DC
  Administered 2018-08-22: 17:00:00 via INTRAVENOUS

## 2018-08-22 NOTE — H&P (Signed)
Acute Kidney Stone  HPI: Erin Salinas is a 57 year-old female patient who is here for further eval and management of kidney stones.  She was diagnosed with a kidney stone on 08/19/2018. The patient presented to 08/19/18 with symptoms of a kidney stone.   Her pain started about approximately 03/21/2018. The pain is on the left side.   Abdomen/Pelvic CT: 11/30. The patient underwent CT scan prior to today's appointment.   The patient relates initially having nausea, vomiting, and flank pain. She is currently having flank pain, back pain, and nausea. She denies having groin pain, vomiting, fever, chills, and voiding symptoms. She has been taking no medications. She has not caught a stone in her urine strainer since her symptoms began.   She has never had surgical treatment for calculi in the past. This is her first kidney stone.   No fevers/chills     ALLERGIES: codeine    MEDICATIONS: Iron  Tylenol  Vitamin D  Zinc     GU PSH: No GU PSH      PSH Notes: hysterectomy (2010), IVF fertility   NON-GU PSH: No Non-GU PSH    GU PMH: None   NON-GU PMH: None   FAMILY HISTORY: Kidney Cancer - Father Lung Cancer - Mother nephrolithiasis - Mother   SOCIAL HISTORY: Marital Status: Married Preferred Language: English; Ethnicity: Not Hispanic Or Latino; Race: White Current Smoking Status: Patient has never smoked.   Tobacco Use Assessment Completed: Used Tobacco in last 30 days? Does drink.  Drinks 4+ caffeinated drinks per day. Patient's occupation Community education officer.    REVIEW OF SYSTEMS:    GU Review Female:   Patient denies frequent urination, hard to postpone urination, burning /pain with urination, get up at night to urinate, leakage of urine, stream starts and stops, trouble starting your stream, have to strain to urinate, and being pregnant.  Gastrointestinal (Upper):   Patient reports nausea and vomiting. Patient denies indigestion/ heartburn.  Gastrointestinal (Lower):    Patient reports constipation. Patient denies diarrhea.  Constitutional:   Patient reports fatigue. Patient denies fever, night sweats, and weight loss.  Skin:   Patient denies skin rash/ lesion and itching.  Eyes:   Patient denies blurred vision and double vision.  Ears/ Nose/ Throat:   Patient denies sinus problems and sore throat.  Hematologic/Lymphatic:   Patient denies swollen glands and easy bruising.  Cardiovascular:   Patient denies leg swelling and chest pains.  Respiratory:   Patient denies cough and shortness of breath.  Endocrine:   Patient reports excessive thirst.   Musculoskeletal:   Patient denies back pain and joint pain.  Neurological:   Patient reports headaches and dizziness.   Psychologic:   Patient denies depression and anxiety.   VITAL SIGNS:      08/22/2018 12:28 PM  Weight 187 lb / 84.82 kg  BP 141/94 mmHg  Pulse 70 /min  Temperature 98.2 F / 36.7 C   MULTI-SYSTEM PHYSICAL EXAMINATION:    Constitutional: Well-nourished. No physical deformities. Normally developed. Good grooming.  Neck: Neck symmetrical, not swollen. Normal tracheal position.  Respiratory: Normal breath sounds. No labored breathing, no use of accessory muscles.   Cardiovascular: Regular rate and rhythm. No murmur, no gallop. Normal temperature, normal extremity pulses, no swelling, no varicosities.   Lymphatic: No enlargement of neck, axillae, groin.  Skin: No paleness, no jaundice, no cyanosis. No lesion, no ulcer, no rash.  Neurologic / Psychiatric: Oriented to time, oriented to place, oriented to person. No depression,  no anxiety, no agitation.  Gastrointestinal: left CVA tenderness  Ears, Nose, Mouth, and Throat: Left ear no scars, no lesions, no masses. Right ear no scars, no lesions, no masses. Nose no scars, no lesions, no masses. Normal hearing. Normal lips.  Musculoskeletal: Normal gait and station of head and neck.     PAST DATA REVIEWED:  Source Of History:  Patient  Records  Review:   Previous Doctor Records, Previous Patient Records, POC Tool  X-Ray Review: C.T. Abdomen/Pelvis: Reviewed Films. Discussed With Patient.     PROCEDURES:          Urinalysis w/Scope Dipstick Dipstick Cont'd Micro  Color: Yellow Bilirubin: Neg mg/dL WBC/hpf: 0 - 5/hpf  Appearance: Clear Ketones: Neg mg/dL RBC/hpf: 0 - 2/hpf  Specific Gravity: 1.025 Blood: 3+ ery/uL Bacteria: Few (10-25/hpf)  pH: 5.5 Protein: 2+ mg/dL Cystals: Ca Oxalate  Glucose: Neg mg/dL Urobilinogen: 0.2 mg/dL Casts: NS (Not Seen)    Nitrites: Neg Trichomonas: Not Present    Leukocyte Esterase: Neg leu/uL Mucous: Present      Epithelial Cells: NS (Not Seen)      Yeast: NS (Not Seen)      Sperm: Not Present    ASSESSMENT:      ICD-10 Details  1 GU:   Ureteral calculus - N20.1    PLAN:           Document Letter(s):  Created for Patient: Clinical Summary    We discussed management options including medical expulsion therapy, shockwave lithotripsy, and ureteroscopy. Ultimately, the patient has opted for shock wave lithotripsy. I discussed with the patient the procedure in detail as well as the risk and benefits. The patient is aware that she may need additional procedures. She also is aware of the risks of hematoma and pain. We will try to get this patient's scheduled as soon as possible.

## 2018-08-22 NOTE — Anesthesia Preprocedure Evaluation (Deleted)
Anesthesia Evaluation Anesthesia Physical Anesthesia Plan Anesthesia Quick Evaluation  

## 2018-08-22 NOTE — Op Note (Signed)
Surgery canceled because the patient likely took oral toradol Sunday at 4am.

## 2018-08-23 ENCOUNTER — Other Ambulatory Visit: Payer: Self-pay | Admitting: Urology

## 2018-08-24 NOTE — H&P (Signed)
Acute Kidney Stone  HPI: Erin Salinas is a 57 year-old female patient who is here for further eval and management of kidney stones.  She was diagnosed with a kidney stone on 08/19/2018. The patient presented to 08/19/18 with symptoms of a kidney stone.   Her pain started about approximately 03/21/2018. The pain is on the left side.   Abdomen/Pelvic CT: 11/30. The patient underwent CT scan prior to today's appointment.   The patient relates initially having nausea, vomiting, and flank pain. She is currently having flank pain, back pain, and nausea. She denies having groin pain, vomiting, fever, chills, and voiding symptoms. She has been taking no medications. She has not caught a stone in her urine strainer since her symptoms began.   She has never had surgical treatment for calculi in the past. This is her first kidney stone.   No fevers/chills     ALLERGIES: codeine    MEDICATIONS: Iron  Tylenol  Vitamin D  Zinc     GU PSH: No GU PSH      PSH Notes: hysterectomy (2010), IVF fertility   NON-GU PSH: No Non-GU PSH    GU PMH: None   NON-GU PMH: None   FAMILY HISTORY: Kidney Cancer - Father Lung Cancer - Mother nephrolithiasis - Mother   SOCIAL HISTORY: Marital Status: Married Preferred Language: English; Ethnicity: Not Hispanic Or Latino; Race: White Current Smoking Status: Patient has never smoked.   Tobacco Use Assessment Completed: Used Tobacco in last 30 days? Does drink.  Drinks 4+ caffeinated drinks per day. Patient's occupation Community education officer.    REVIEW OF SYSTEMS:    GU Review Female:   Patient denies frequent urination, hard to postpone urination, burning /pain with urination, get up at night to urinate, leakage of urine, stream starts and stops, trouble starting your stream, have to strain to urinate, and being pregnant.  Gastrointestinal (Upper):   Patient reports nausea and vomiting. Patient denies indigestion/ heartburn.  Gastrointestinal (Lower):    Patient reports constipation. Patient denies diarrhea.  Constitutional:   Patient reports fatigue. Patient denies fever, night sweats, and weight loss.  Skin:   Patient denies skin rash/ lesion and itching.  Eyes:   Patient denies blurred vision and double vision.  Ears/ Nose/ Throat:   Patient denies sinus problems and sore throat.  Hematologic/Lymphatic:   Patient denies swollen glands and easy bruising.  Cardiovascular:   Patient denies leg swelling and chest pains.  Respiratory:   Patient denies cough and shortness of breath.  Endocrine:   Patient reports excessive thirst.   Musculoskeletal:   Patient denies back pain and joint pain.  Neurological:   Patient reports headaches and dizziness.   Psychologic:   Patient denies depression and anxiety.   VITAL SIGNS:      08/22/2018 12:28 PM  Weight 187 lb / 84.82 kg  BP 141/94 mmHg  Pulse 70 /min  Temperature 98.2 F / 36.7 C   MULTI-SYSTEM PHYSICAL EXAMINATION:    Constitutional: Well-nourished. No physical deformities. Normally developed. Good grooming.  Neck: Neck symmetrical, not swollen. Normal tracheal position.  Respiratory: Normal breath sounds. No labored breathing, no use of accessory muscles.   Cardiovascular: Regular rate and rhythm. No murmur, no gallop. Normal temperature, normal extremity pulses, no swelling, no varicosities.   Lymphatic: No enlargement of neck, axillae, groin.  Skin: No paleness, no jaundice, no cyanosis. No lesion, no ulcer, no rash.  Neurologic / Psychiatric: Oriented to time, oriented to place, oriented to person. No depression,  no anxiety, no agitation.  Gastrointestinal: left CVA tenderness  Ears, Nose, Mouth, and Throat: Left ear no scars, no lesions, no masses. Right ear no scars, no lesions, no masses. Nose no scars, no lesions, no masses. Normal hearing. Normal lips.  Musculoskeletal: Normal gait and station of head and neck.     PAST DATA REVIEWED:  Source Of History:  Patient  Records  Review:   Previous Doctor Records, Previous Patient Records, POC Tool  X-Ray Review: C.T. Abdomen/Pelvis: Reviewed Films. Discussed With Patient.     PROCEDURES:          Urinalysis w/Scope Dipstick Dipstick Cont'd Micro  Color: Yellow Bilirubin: Neg mg/dL WBC/hpf: 0 - 5/hpf  Appearance: Clear Ketones: Neg mg/dL RBC/hpf: 0 - 2/hpf  Specific Gravity: 1.025 Blood: 3+ ery/uL Bacteria: Few (10-25/hpf)  pH: 5.5 Protein: 2+ mg/dL Cystals: Ca Oxalate  Glucose: Neg mg/dL Urobilinogen: 0.2 mg/dL Casts: NS (Not Seen)    Nitrites: Neg Trichomonas: Not Present    Leukocyte Esterase: Neg leu/uL Mucous: Present      Epithelial Cells: NS (Not Seen)      Yeast: NS (Not Seen)      Sperm: Not Present    ASSESSMENT:      ICD-10 Details  1 GU:   Ureteral calculus - N20.1    PLAN:           Document Letter(s):  Created for Patient: Clinical Summary    We discussed management options including medical expulsion therapy, shockwave lithotripsy, and ureteroscopy. Ultimately, the patient has opted for shock wave lithotripsy. I discussed with the patient the procedure in detail as well as the risk and benefits. The patient is aware that she may need additional procedures. She also is aware of the risks of hematoma and pain. We will try to get this patient's scheduled as soon as possible.   After a thorough review of the management options for the patient's condition the patient  elected to proceed with surgical therapy as noted above. We have discussed the potential benefits and risks of the procedure, side effects of the proposed treatment, the likelihood of the patient achieving the goals of the procedure, and any potential problems that might occur during the procedure or recuperation. Informed consent has been obtained.

## 2018-08-25 ENCOUNTER — Encounter (HOSPITAL_COMMUNITY): Payer: Self-pay | Admitting: General Practice

## 2018-08-25 ENCOUNTER — Encounter (HOSPITAL_COMMUNITY)
Admission: RE | Disposition: A | Discharge status: Home / Self Care | Payer: Self-pay | Source: Ambulatory Visit | Attending: Urology

## 2018-08-25 ENCOUNTER — Ambulatory Visit (HOSPITAL_COMMUNITY): Payer: BC Managed Care – PPO

## 2018-08-25 ENCOUNTER — Ambulatory Visit (HOSPITAL_COMMUNITY)
Admission: RE | Admit: 2018-08-25 | Discharge: 2018-08-25 | Disposition: A | Discharge status: Home / Self Care | Payer: BC Managed Care – PPO | Source: Ambulatory Visit | Attending: Urology | Admitting: Urology

## 2018-08-25 DIAGNOSIS — N200 Calculus of kidney: Secondary | ICD-10-CM | POA: Insufficient documentation

## 2018-08-25 DIAGNOSIS — N201 Calculus of ureter: Secondary | ICD-10-CM

## 2018-08-25 DIAGNOSIS — Z885 Allergy status to narcotic agent status: Secondary | ICD-10-CM | POA: Insufficient documentation

## 2018-08-25 HISTORY — PX: EXTRACORPOREAL SHOCK WAVE LITHOTRIPSY: SHX1557

## 2018-08-25 HISTORY — DX: Dizziness and giddiness: R42

## 2018-08-25 SURGERY — LITHOTRIPSY, ESWL
Anesthesia: LOCAL | Laterality: Left

## 2018-08-25 MED ORDER — DIAZEPAM 5 MG PO TABS
10.0000 mg | ORAL_TABLET | ORAL | Status: AC
  Administered 2018-08-25: 10 mg via ORAL
  Filled 2018-08-25: qty 2

## 2018-08-25 MED ORDER — CIPROFLOXACIN HCL 500 MG PO TABS
500.0000 mg | ORAL_TABLET | ORAL | Status: AC
  Administered 2018-08-25: 500 mg via ORAL
  Filled 2018-08-25: qty 1

## 2018-08-25 MED ORDER — SODIUM CHLORIDE 0.9 % IV SOLN
INTRAVENOUS | Status: DC
  Administered 2018-08-25: 11:00:00 via INTRAVENOUS

## 2018-08-25 MED ORDER — DIPHENHYDRAMINE HCL 25 MG PO CAPS
25.0000 mg | ORAL_CAPSULE | ORAL | Status: AC
  Administered 2018-08-25: 25 mg via ORAL
  Filled 2018-08-25: qty 1

## 2018-08-25 NOTE — Discharge Instructions (Signed)
Moderate Conscious Sedation, Adult, Care After °These instructions provide you with information about caring for yourself after your procedure. Your health care provider may also give you more specific instructions. Your treatment has been planned according to current medical practices, but problems sometimes occur. Call your health care provider if you have any problems or questions after your procedure. °What can I expect after the procedure? °After your procedure, it is common: °To feel sleepy for several hours. °To feel clumsy and have poor balance for several hours. °To have poor judgment for several hours. °To vomit if you eat too soon. ° °Follow these instructions at home: °For at least 24 hours after the procedure: ° °Do not: °Participate in activities where you could fall or become injured. °Drive. °Use heavy machinery. °Drink alcohol. °Take sleeping pills or medicines that cause drowsiness. °Make important decisions or sign legal documents. °Take care of children on your own. °Rest. °Eating and drinking °Follow the diet recommended by your health care provider. °If you vomit: °Drink water, juice, or soup when you can drink without vomiting. °Make sure you have little or no nausea before eating solid foods. °General instructions °Have a responsible adult stay with you until you are awake and alert. °Take over-the-counter and prescription medicines only as told by your health care provider. °If you smoke, do not smoke without supervision. °Keep all follow-up visits as told by your health care provider. This is important. °Contact a health care provider if: °You keep feeling nauseous or you keep vomiting. °You feel light-headed. °You develop a rash. °You have a fever. °Get help right away if: °You have trouble breathing. °This information is not intended to replace advice given to you by your health care provider. Make sure you discuss any questions you have with your health care provider. °Document Released:  06/28/2013 Document Revised: 02/10/2016 Document Reviewed: 12/28/2015 °Elsevier Interactive Patient Education © 2018 Else[employer] Inc[employer] °Lithotripsy, Care After °This sheet gives you information about how to care for yourself after your procedure. Your health care provider may also give you more specific instructions. If you have problems or questions, contact your health care provider. °What can I expect after the procedure? °After the procedure, it is common to have: °· Some blood in your urine. This should only last for a few days. °· Soreness in your back, sides, or upper abdomen for a few days. °· Blotches or bruises on your back where the pressure wave entered the skin. °· Pain, discomfort, or nausea when pieces (fragments) of the kidney stone move through the tube that carries urine from the kidney to the bladder (ureter). Stone fragments may pass soon after the procedure, but they may continue to pass for up to 4-8 weeks. °? If you have severe pain or nausea, contact your health care provider. This may be caused by a large stone that was not broken up, and this may mean that you need more treatment. °· Some pain or discomfort during urination. °· Some pain or discomfort in the lower abdomen or (in men) at the base of the penis. ° °Follow these instructions at home: °Medicines °· Take over-the-counter and prescription medicines only as told by your health care provider. °· If you were prescribed an antibiotic medicine, take it as told by your health care provider. Do not stop taking the antibiotic even if you start to feel better. °· Do not drive for 24 hours if you were given a medicine to help you relax (sedative). °· Do not drive   or use heavy machinery while taking prescription pain medicine. °Eating and drinking °· Drink enough water and fluids to keep your urine clear or pale yellow. This helps any remaining pieces of the stone to pass. It can also help prevent new stones from forming. °· Eat plenty of fresh  fruits and vegetables. °· Follow instructions from your health care provider about eating and drinking restrictions. You may be instructed: °? To reduce how much salt (sodium) you eat or drink. Check ingredients and nutrition facts on packaged foods and beverages. °? To reduce how much meat you eat. °· Eat the recommended amount of calcium for your age and gender. Ask your health care provider how much calcium you should have. °General instructions °· Get plenty of rest. °· Most people can resume normal activities 1-2 days after the procedure. Ask your health care provider what activities are safe for you. °· If directed, strain all urine through the strainer that was provided by your health care provider. °? Keep all fragments for your health care provider to see. Any stones that are found may be sent to a medical lab for examination. The stone may be as small as a grain of salt. °· Keep all follow-up visits as told by your health care provider. This is important. °Contact a health care provider if: °· You have pain that is severe or does not get better with medicine. °· You have nausea that is severe or does not go away. °· You have blood in your urine longer than your health care provider told you to expect. °· You have more blood in your urine. °· You have pain during urination that does not go away. °· You urinate more frequently than usual and this does not go away. °· You develop a rash or any other possible signs of an allergic reaction. °Get help right away if: °· You have severe pain in your back, sides, or upper abdomen. °· You have severe pain while urinating. °· Your urine is very dark red. °· You have blood in your stool (feces). °· You cannot pass any urine at all. °· You feel a strong urge to urinate after emptying your bladder. °· You have a fever or chills. °· You develop shortness of breath, difficulty breathing, or chest pain. °· You have severe nausea that leads to persistent vomiting. °· You  faint. °Summary °· After this procedure, it is common to have some pain, discomfort, or nausea when pieces (fragments) of the kidney stone move through the tube that carries urine from the kidney to the bladder (ureter). If this pain or nausea is severe, however, you should contact your health care provider. °· Most people can resume normal activities 1-2 days after the procedure. Ask your health care provider what activities are safe for you. °· Drink enough water and fluids to keep your urine clear or pale yellow. This helps any remaining pieces of the stone to pass, and it can help prevent new stones from forming. °· If directed, strain your urine and keep all fragments for your health care provider to see. Fragments or stones may be as small as a grain of salt. °· Get help right away if you have severe pain in your back, sides, or upper abdomen or have severe pain while urinating. °This information is not intended to replace advice given to you by your health care provider. Make sure you discuss any questions you have with your health care provider. °Document Released: 09/27/2007 Document Revised:   07/29/2016 Document Reviewed: 07/29/2016 °Elsevier Interactive Patient Education © 2018 Elsevier Inc. ° °

## 2018-08-25 NOTE — Interval H&P Note (Signed)
History and Physical Interval Note:  08/25/2018 11:16 AM  Erin Salinas  has presented today for surgery, with the diagnosis of LEFT URETEROPELVIC JUNCTION STONE  The various methods of treatment have been discussed with the patient and family. After consideration of risks, benefits and other options for treatment, the patient has consented to  Procedure(s): EXTRACORPOREAL SHOCK WAVE LITHOTRIPSY (ESWL) (Left) as a surgical intervention .  The patient's history has been reviewed, patient examined, no change in status, stable for surgery.  I have reviewed the patient's chart and labs.  Questions were answered to the patient's satisfaction.     Esme Durkin A

## 2018-08-26 ENCOUNTER — Encounter (HOSPITAL_COMMUNITY): Payer: Self-pay | Admitting: Urology

## 2018-11-29 ENCOUNTER — Encounter: Payer: Self-pay | Admitting: Women's Health

## 2018-11-29 ENCOUNTER — Ambulatory Visit: Payer: BC Managed Care – PPO | Admitting: Women's Health

## 2018-11-29 VITALS — BP 120/82

## 2018-11-29 DIAGNOSIS — R1032 Left lower quadrant pain: Secondary | ICD-10-CM | POA: Diagnosis not present

## 2018-11-29 NOTE — Progress Notes (Signed)
38 MWF G3P2 presents with complaints of lower left abdominal pain that started in December. The pain is sharp, occurs randomly, and is worse when walking on stairs or up hill. Denies urinary symptoms, vaginal bleeding, discharge, nausea, fever,or GI changes. 08/25/2018 lithotripsy done for left ureteral stone.  Was seen at urologist yesterday negative renal x-ray.  [id number] pelvic US showed left ovarian cyst. TVH for endometriosis and fibroids on no HRT.   Exam: Appears well, came from work.[place]  No CVAT. Abdomen soft, nontender, without masses, guarding and rebound. External vagina normal with mild atrophy, cervix and uterus absent, no discharge.  Rectal exam negative, no palpated masses or tenderness.  Left lower abdominal pain, unknown source History of left kidney stone  Plan: Schedule pelvic ultrasound to rule out GYN nature of pain, Naproxen as needed for pain, discussed appropriate diet for kidney stone prevention.

## 2018-11-30 ENCOUNTER — Ambulatory Visit (INDEPENDENT_AMBULATORY_CARE_PROVIDER_SITE_OTHER): Payer: BC Managed Care – PPO

## 2018-11-30 ENCOUNTER — Other Ambulatory Visit: Payer: Self-pay

## 2018-11-30 ENCOUNTER — Ambulatory Visit: Payer: BC Managed Care – PPO | Admitting: Women's Health

## 2018-11-30 ENCOUNTER — Encounter: Payer: Self-pay | Admitting: Women's Health

## 2018-11-30 VITALS — BP 128/88 | Wt 200.0 lb

## 2018-11-30 DIAGNOSIS — R1032 Left lower quadrant pain: Secondary | ICD-10-CM

## 2018-11-30 NOTE — Patient Instructions (Signed)
miralaxPolyethylene Glycol powder What is this medicine? POLYETHYLENE GLYCOL 3350 (pol ee ETH i leen; GLYE col) powder is a laxative used to treat constipation. It increases the amount of water in the stool. Bowel movements become easier and more frequent. This medicine may be used for other purposes; ask your health care provider or pharmacist if you have questions. COMMON BRAND NAME(S): Sharlyn Bologna, GlycoLax, Healthylax, MiraLax, Smooth LAX, [facility] What should I tell my health care provider before I take this medicine? They need to know if you have any of these conditions: -a history of blockage of the stomach or intestine -current abdomen distension or pain -difficulty swallowing -diverticulitis, ulcerative colitis, or other chronic bowel disease -phenylketonuria -an unusual or allergic reaction to polyethylene glycol, other medicines, dyes, or preservatives -pregnant or trying to get pregnant -breast-feeding How should I use this medicine? Take this medicine by mouth. The bottle has a measuring cap that is marked with a line. Pour the powder into the cap up to the marked line (the dose is about 1 heaping tablespoon). Add the powder in the cap to a full glass (4 to 8 ounces or 120 to 240 mL) of water, juice, soda, coffee or tea. Mix the powder well. Ensure that the powder is fully dissolved. Do not drink if there are any clumps. Drink the solution. Take exactly as directed. Do not take your medicine more often than directed. Talk to your pediatrician regarding the use of this medicine in children. Special care may be needed. Overdosage: If you think you have taken too much of this medicine contact a poison control center or emergency room at once. NOTE: This medicine is only for you. Do not share this medicine with others. What if I miss a dose? If you miss a dose, take it as soon as you can. If it is almost time for your next dose, take only that dose. Do not take double or extra  doses. What may interact with this medicine? Interactions are not expected. This list may not describe all possible interactions. Give your health care provider a list of all the medicines, herbs, non-prescription drugs, or dietary supplements you use. Also tell them if you smoke, drink alcohol, or use illegal drugs. Some items may interact with your medicine. What should I watch for while using this medicine? Do not use for more than 2 weeks without advice from your doctor or health care professional. It can take 2 to 4 days to have a bowel movement and to experience improvement in constipation. See your health care professional for any changes in bowel habits, including constipation, that are severe or last longer than three weeks. Always take this medicine with plenty of water. What side effects may I notice from receiving this medicine? Side effects that you should report to your doctor or health care professional as soon as possible: -diarrhea -difficulty breathing -itching of the skin, hives, or skin rash -severe bloating, pain, or distension of the stomach -vomiting Side effects that usually do not require medical attention (report to your doctor or health care professional if they continue or are bothersome): -bloating or gas -lower abdominal discomfort or cramps -nausea This list may not describe all possible side effects. Call your doctor for medical advice about side effects. You may report side effects to FDA at 1-800-FDA-1088. Where should I keep my medicine? Keep out of the reach of children. Store between 15 and 30 degrees C (59 and 86 degrees F). Throw away any unused  medicine after the expiration date. NOTE: This sheet is a summary. It may not cover all possible information. If you have questions about this medicine, talk to your doctor, pharmacist, or health care provider.  2019 Elsevier/Gold Standard (2018-02-24 10:42:01)

## 2018-11-30 NOTE — Progress Notes (Signed)
58 year old MWF G3 P2 presents for ultrasound.  Was seen in the office for left lower quadrant pain intermittent in nature sharp, stabbing at times.  Denies vaginal discharge, urinary symptoms, GI changes, or fever.  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  Ultrasound: T/V images S/P hysterectomy.  Vaginal cuff normal.  Negative cul-de-sac.  Right and left ovary normal.  Previous cyst on left ovary not seen.  Noted loops of bowel left adnexa.  No apparent mass.  Normal GYN ultrasound  Plan: Reviewed possible GI causing pain, will try MiraLAX daily for 2 weeks if continued discomfort and no relief encouraged to follow-up with GI.  Continue active lifestyle of regular walking.

## 2018-12-28 ENCOUNTER — Other Ambulatory Visit: Payer: Self-pay | Admitting: Family Medicine

## 2018-12-28 DIAGNOSIS — Z1231 Encounter for screening mammogram for malignant neoplasm of breast: Secondary | ICD-10-CM

## 2019-03-13 ENCOUNTER — Ambulatory Visit
Admission: RE | Admit: 2019-03-13 | Discharge: 2019-03-13 | Disposition: A | Discharge status: Home / Self Care | Payer: BC Managed Care – PPO | Source: Ambulatory Visit | Attending: Family Medicine | Admitting: Family Medicine

## 2019-03-13 ENCOUNTER — Other Ambulatory Visit: Payer: Self-pay

## 2019-03-13 DIAGNOSIS — Z1231 Encounter for screening mammogram for malignant neoplasm of breast: Secondary | ICD-10-CM

## 2019-03-27 ENCOUNTER — Encounter: Payer: BC Managed Care – PPO | Admitting: Women's Health

## 2019-04-28 ENCOUNTER — Other Ambulatory Visit: Payer: Self-pay | Admitting: Women's Health

## 2019-04-28 NOTE — Telephone Encounter (Signed)
Erin Salinas called patient to schedule CE and wrote me back: "I called her and she doesn't want to come into any drs office right now. I told her we had called in 40m of pills and to get more she will need to make appt for CE. She will think about it and get back to [place]."

## 2019-04-28 NOTE — Telephone Encounter (Signed)
Staff messaged Erin Salinas and asked her to call patient to schedule CE since past due.

## 2019-12-18 ENCOUNTER — Ambulatory Visit: Payer: BC Managed Care – PPO | Admitting: Podiatry

## 2019-12-18 ENCOUNTER — Encounter: Payer: Self-pay | Admitting: Podiatry

## 2019-12-18 ENCOUNTER — Other Ambulatory Visit: Payer: Self-pay

## 2019-12-18 VITALS — BP 134/79 | HR 71 | Resp 14

## 2019-12-18 DIAGNOSIS — M79671 Pain in right foot: Secondary | ICD-10-CM

## 2019-12-18 DIAGNOSIS — N200 Calculus of kidney: Secondary | ICD-10-CM | POA: Insufficient documentation

## 2019-12-18 DIAGNOSIS — B070 Plantar wart: Secondary | ICD-10-CM | POA: Diagnosis not present

## 2019-12-18 NOTE — Patient Instructions (Signed)
Take dressing off in 8 hours and wash the foot with soap and water. If it is hurting or becomes uncomfortable before the 8 hours, go ahead and remove the bandage and wash the area.  If it blisters, apply antibiotic ointment and a band-aid.  Monitor for any signs/symptoms of infection. Call the office immediately if any occur or go directly to the emergency room. Call with any questions/concerns.   

## 2019-12-19 NOTE — Progress Notes (Signed)
Subjective:   Patient ID: Erin Salinas, female   DOB: 59 y.o.   MRN: [id number]   HPI 59 year old female presents to the office today for concerns of a skin lesion on her left foot which she noticed for 3 weeks ago.  She states that she felt there is also compatible.  She said no recent treatment.  She does do quite a bit of walking.  No recent injury or stepping on any foreign object.  No other concerns.   Review of Systems  All other systems reviewed and are negative.  Past Medical History:  Diagnosis Date  . Anxiety   . Endometriosis   . Fibroid   . History of kidney stones   . Vertigo     Past Surgical History:  Procedure Laterality Date  . ABDOMINAL HYSTERECTOMY  2013   ABDOMINAL HYSTERECTOMY WITH OVARIAN PRESERVATION   . CESAREAN SECTION     x2  . EXTRACORPOREAL SHOCK WAVE LITHOTRIPSY Left 08/22/2018   Procedure: EXTRACORPOREAL SHOCK WAVE LITHOTRIPSY (ESWL);  Surgeon: Ardis Hughs, MD;  Location: [facility];  Service: Urology;  Laterality: Left;  . EXTRACORPOREAL SHOCK WAVE LITHOTRIPSY Left 08/25/2018   Procedure: EXTRACORPOREAL SHOCK WAVE LITHOTRIPSY (ESWL);  Surgeon: Bjorn Loser, MD;  Location: [facility];  Service: Urology;  Laterality: Left;  . PELVIC LAPAROSCOPY       Current Outpatient Medications:  .  Cyanocobalamin (VITAMIN B-12 PO), Take 1 tablet by mouth daily., Disp: , Rfl:  .  Estradiol 10 MCG TABS vaginal tablet, Insert one tab intravaginally twice weekly., Disp: 24 tablet, Rfl: 0 .  IRON PO, Take 1 tablet by mouth daily., Disp: , Rfl:  .  Omega-3 Fatty Acids (FISH OIL PO), Take 1 tablet by mouth daily., Disp: , Rfl:  .  ondansetron (ZOFRAN ODT) 4 MG disintegrating tablet, Take 1 tablet (4 mg total) by mouth every 8 (eight) hours as needed for nausea. (Patient not taking: Reported on 11/30/2018), Disp: 20 tablet, Rfl: 0 .  VITAMIN D PO, Take 1 tablet by mouth daily., Disp: , Rfl:   Allergies  Allergen Reactions  . Codeine           Objective:   Physical Exam  General: AAO x3, NAD  Dermatological: On the left foot at the sulcus just distal to the MPJ is a small hyperkeratotic lesion with evidence of verruca.  There is no edema, erythema or any signs of infection.  Minimal callus formation submetatarsal 5 without any pain and she has not noticed this.  Vascular: Dorsalis Pedis artery and Posterior Tibial artery pedal pulses are 2/4 bilateral with immedate capillary fill time. There is no pain with calf compression, swelling, warmth, erythema.   Neruologic: Grossly intact via light touch bilateral.   Musculoskeletal: No gross boney pedal deformities bilateral. No pain, crepitus, or limitation noted with foot and ankle range of motion bilateral. Muscular strength 5/5 in all groups tested bilateral.  Gait: Unassisted, Nonantalgic.       Assessment:   Verruca left foot     Plan:  -Treatment options discussed including all alternatives, risks, and complications -Etiology of symptoms were discussed -Lesion was debrided without any complications utilizing the #312 with scalpel.  The skin was cleaned with alcohol.  Cantharone was applied followed by an occlusive bandage.  Post procedure instructions were discussed.  Monitor for any signs or symptoms of infection.  Return if symptoms worsen or fail to improve.  Trula Slade DPM

## 2020-09-03 ENCOUNTER — Other Ambulatory Visit: Payer: Self-pay

## 2020-11-19 ENCOUNTER — Other Ambulatory Visit: Payer: Self-pay | Admitting: Family Medicine

## 2020-11-19 DIAGNOSIS — Z1231 Encounter for screening mammogram for malignant neoplasm of breast: Secondary | ICD-10-CM

## 2021-01-09 ENCOUNTER — Ambulatory Visit: Payer: BC Managed Care – PPO | Admitting: Nurse Practitioner

## 2021-01-10 ENCOUNTER — Other Ambulatory Visit: Payer: Self-pay

## 2021-01-10 ENCOUNTER — Ambulatory Visit
Admission: RE | Admit: 2021-01-10 | Discharge: 2021-01-10 | Disposition: A | Discharge status: Home / Self Care | Payer: BC Managed Care – PPO | Source: Ambulatory Visit | Attending: Family Medicine | Admitting: Family Medicine

## 2021-01-10 DIAGNOSIS — Z1231 Encounter for screening mammogram for malignant neoplasm of breast: Secondary | ICD-10-CM

## 2021-02-24 ENCOUNTER — Ambulatory Visit (INDEPENDENT_AMBULATORY_CARE_PROVIDER_SITE_OTHER): Payer: BC Managed Care – PPO

## 2021-02-24 ENCOUNTER — Ambulatory Visit: Payer: BC Managed Care – PPO | Admitting: Podiatry

## 2021-02-24 ENCOUNTER — Other Ambulatory Visit: Payer: Self-pay

## 2021-02-24 DIAGNOSIS — M7989 Other specified soft tissue disorders: Secondary | ICD-10-CM

## 2021-02-24 DIAGNOSIS — S92511A Displaced fracture of proximal phalanx of right lesser toe(s), initial encounter for closed fracture: Secondary | ICD-10-CM | POA: Diagnosis not present

## 2021-02-24 MED ORDER — IBUPROFEN 800 MG PO TABS: 800.0000 mg | ORAL_TABLET | Freq: Three times a day (TID) | ORAL | 0 refills | Status: DC | PRN

## 2021-02-24 NOTE — Patient Instructions (Signed)
Toe Fracture A toe fracture is a break in one of the toe bones (phalanges). This may happen if you:  Drop a heavy object on your toe.  Stub your toe.  Twist your toe.  Exercise the same way too much. What are the signs or symptoms? The main symptoms are swelling and pain in the toe. You may also have:  Bruising.  Stiffness.  Numbness.  A change in the way the toe looks.  Broken bones that poke through the skin.  Blood under the toenail.   How is this treated? Treatments may include:  Taping the broken toe to a toe that is next to it (buddy taping).  Wearing a shoe that has a wide, rigid sole to protect the toe and to limit its movement.  Wearing a cast.  Surgery. This may be needed if the: ? Pieces of broken bone are out of place. ? Bone pokes through the skin.  Physical therapy. Follow these instructions at home: If you have a shoe:  Wear the shoe as told by your doctor. Remove it only as told by your doctor.  Loosen the shoe if your toes tingle, become numb, or turn cold and blue.  Keep the shoe clean and dry. If you have a cast:  Do not put pressure on any part of the cast until it is fully hardened. This may take a few hours.  Do not stick anything inside the cast to scratch your skin.  Check the skin around the cast every day. Tell your doctor about any concerns.  You may put lotion on dry skin around the edges of the cast.  Do not put lotion on the skin under the cast.  Keep the cast clean and dry. Bathing  Do not take baths, swim, or use a hot tub until your doctor says it is okay. Ask your doctor if you can take showers.  If the shoe or cast is not waterproof: ? Do not let it get wet. ? Cover it with a watertight covering when you take a bath or a shower. Activity  Do not use your foot to support your body weight until your doctor says it is okay.  Use crutches as told by your doctor.  Ask your doctor what activities are safe for you  during recovery.  Avoid activities as told by your doctor.  Do exercises as told by your doctor or therapist. Driving  Do not drive or use heavy machinery while taking pain medicine.  Do not drive while wearing a cast on a foot that you use for driving. Managing pain, stiffness, and swelling  Put ice on the injured area if told by your doctor: ? Put ice in a plastic bag. ? Place a towel between your skin and the bag.  If you have a shoe, remove it as told by your doctor.  If you have a cast, place a towel between your cast and the bag. ? Leave the ice on for 20 minutes, 2-3 times per day.  Raise (elevate) the injured area above the level of your heart while you are sitting or lying down.   General instructions  If your toe was taped to a toe that is next to it, follow your doctor's instructions for changing the gauze and tape. Change it more often: ? If the gauze and tape get wet. If this happens, dry the space between the toes. ? If the gauze and tape are too tight and they cause your toe  to become pale or to lose feeling (go numb).  If your doctor did not give you a protective shoe, wear sturdy shoes that support your foot. Your shoes should not: ? Pinch your toes. ? Fit tightly against your toes.  Do not use any tobacco products, including cigarettes, chewing tobacco, or e-cigarettes. These can delay bone healing. If you need help quitting, ask your doctor.  Take medicines only as told by your doctor.  Keep all follow-up visits as told by your doctor. This is important. Contact a doctor if:  Your pain medicine is not helping.  You have a fever.  You notice a bad smell coming from your cast. Get help right away if:  You lose feeling (have numbness) in your toe or foot, and it is getting worse.  Your toe or your foot tingles.  Your toe or your foot gets cold or turns blue.  You have redness or swelling in your toe or foot, and it is getting worse.  You have very  bad pain. Summary  A toe fracture is a break in one of the toe bones.  Use ice and raise your foot. This will help lessen pain and swelling.  Use crutches as told by your doctor. This information is not intended to replace advice given to you by your health care provider. Make sure you discuss any questions you have with your health care provider. Document Revised: 11/11/2017 Document Reviewed: 10/19/2017 Elsevier Patient Education  2021 [employer].

## 2021-02-24 NOTE — Progress Notes (Signed)
Subjective: 60 year old female presents the office today for concerns of right fifth toe pain.  She states that she stubbed her toe about 4 weeks ago and since then has been swollen.  She states the swelling is the same as it has been.  She is able to wear shoe and having some discomfort of the toe but concerned about the swelling which is residual.  She had no recent treatment since the injury. Denies any systemic complaints such as fevers, chills, nausea, vomiting. No acute changes since last appointment, and no other complaints at this time.   Objective: AAO x3, NAD DP/PT pulses palpable bilaterally, CRT less than 3 seconds There is moderate edema to the right fifth toe.  There is no skin breakdown.  The toe sits in a rectus position.  There is tenderness palpation along the toe itself on the PIPJ.  No pain of the metatarsal or other digits/metatarsals. No pain with calf compression, swelling, warmth, erythema  Assessment: Right fifth toe fracture, swelling  Plan: -All treatment options discussed with the patient including all alternatives, risks, complications.  -X-rays obtained reviewed which did reveal mildly displaced fracture of the fifth digit. -I showed her how to tape the toe with Coflex in order to help with some compression.  She states it felt better after doing this.  Dispensed surgical shoe.  Ice and elevation.  Ibuprofen and milligrams as needed. -Patient encouraged to call the office with any questions, concerns, change in symptoms.   Return in about 3 weeks (around 03/17/2021).  Repeat x-ray next appointment  Trula Slade DPM

## 2021-03-11 ENCOUNTER — Ambulatory Visit: Payer: BC Managed Care – PPO

## 2021-03-11 ENCOUNTER — Ambulatory Visit (INDEPENDENT_AMBULATORY_CARE_PROVIDER_SITE_OTHER): Payer: BC Managed Care – PPO | Admitting: Podiatry

## 2021-03-11 ENCOUNTER — Other Ambulatory Visit: Payer: Self-pay | Admitting: Podiatry

## 2021-03-11 ENCOUNTER — Ambulatory Visit (INDEPENDENT_AMBULATORY_CARE_PROVIDER_SITE_OTHER): Payer: BC Managed Care – PPO

## 2021-03-11 ENCOUNTER — Other Ambulatory Visit: Payer: Self-pay

## 2021-03-11 DIAGNOSIS — S92511G Displaced fracture of proximal phalanx of right lesser toe(s), subsequent encounter for fracture with delayed healing: Secondary | ICD-10-CM

## 2021-03-11 DIAGNOSIS — M7989 Other specified soft tissue disorders: Secondary | ICD-10-CM

## 2021-03-11 DIAGNOSIS — S92911A Unspecified fracture of right toe(s), initial encounter for closed fracture: Secondary | ICD-10-CM

## 2021-03-11 NOTE — Patient Instructions (Signed)
You can use Voltaren gel

## 2021-03-19 NOTE — Progress Notes (Signed)
Subjective: 60 year old female presents the office for follow-up evaluation of right fifth toe fracture which occurred on May 6.  She states the toe still swollen and she has been trying to tape the toe.  Still has discomfort with certain shoes as it puts pressure given the swelling.  No recent new changes otherwise. Denies any systemic complaints such as fevers, chills, nausea, vomiting. No acute changes since last appointment, and no other complaints at this time.   Objective: AAO x3, NAD DP/PT pulses palpable bilaterally, CRT less than 3 seconds There is continued edema present along the fifth toe on the right foot but appears to be somewhat improved.  Mild discomfort of the toe.  The toe is rectus position.  No pain in the metatarsals or other areas.  No open lesions. No pain with calf compression, swelling, warmth, erythema  Assessment: Right fifth toe fracture  Plan: -All treatment options discussed with the patient including all alternatives, risks, complications.  -Repeat x-rays obtained and reviewed.  This Is some increased consolidation at the fracture site.  No evidence of new fracture otherwise. -She is to continue with wearing stiffer soled shoe and offloading.  Continue to tape the toe for additional stability.  She does get her vitamin D level checked this week.  We will try for bone stimulator if we get insurance to cover this.  Trula Slade DPM

## 2021-03-21 ENCOUNTER — Telehealth: Payer: Self-pay

## 2021-03-21 NOTE — Telephone Encounter (Signed)
Referral notes sent from [facility] at [place], Phone #: [phone], Fax #: [phone]   Notes sent to scheduling

## 2021-04-11 ENCOUNTER — Ambulatory Visit: Payer: BC Managed Care – PPO | Admitting: Podiatry

## 2021-07-23 ENCOUNTER — Ambulatory Visit: Payer: BC Managed Care – PPO | Admitting: Cardiology

## 2021-10-02 ENCOUNTER — Ambulatory Visit: Payer: BC Managed Care – PPO | Admitting: Cardiology

## 2021-10-13 ENCOUNTER — Telehealth: Payer: Self-pay

## 2021-10-13 NOTE — Telephone Encounter (Signed)
I left a message for the patient to return call about switching to VV with Dr. Radford Pax on 10-14-21

## 2021-10-14 ENCOUNTER — Ambulatory Visit: Payer: BC Managed Care – PPO | Admitting: Cardiology

## 2021-12-17 ENCOUNTER — Other Ambulatory Visit: Payer: Self-pay | Admitting: Family Medicine

## 2021-12-17 DIAGNOSIS — Z1231 Encounter for screening mammogram for malignant neoplasm of breast: Secondary | ICD-10-CM

## 2022-03-09 ENCOUNTER — Ambulatory Visit
Admission: RE | Admit: 2022-03-09 | Discharge: 2022-03-09 | Disposition: A | Discharge status: Home / Self Care | Payer: BC Managed Care – PPO | Source: Ambulatory Visit | Attending: Family Medicine | Admitting: Family Medicine

## 2022-03-09 DIAGNOSIS — Z1231 Encounter for screening mammogram for malignant neoplasm of breast: Secondary | ICD-10-CM

## 2022-03-11 ENCOUNTER — Other Ambulatory Visit: Payer: Self-pay | Admitting: Family Medicine

## 2022-03-11 DIAGNOSIS — R928 Other abnormal and inconclusive findings on diagnostic imaging of breast: Secondary | ICD-10-CM

## 2022-03-18 ENCOUNTER — Ambulatory Visit
Admission: RE | Admit: 2022-03-18 | Discharge: 2022-03-18 | Disposition: A | Discharge status: Home / Self Care | Payer: BC Managed Care – PPO | Source: Ambulatory Visit | Attending: Family Medicine | Admitting: Family Medicine

## 2022-03-18 ENCOUNTER — Other Ambulatory Visit: Payer: Self-pay | Admitting: Family Medicine

## 2022-03-18 DIAGNOSIS — R928 Other abnormal and inconclusive findings on diagnostic imaging of breast: Secondary | ICD-10-CM

## 2022-03-18 DIAGNOSIS — N6489 Other specified disorders of breast: Secondary | ICD-10-CM

## 2022-03-30 ENCOUNTER — Ambulatory Visit
Admission: RE | Admit: 2022-03-30 | Discharge: 2022-03-30 | Disposition: A | Discharge status: Home / Self Care | Payer: BC Managed Care – PPO | Source: Ambulatory Visit | Attending: Family Medicine | Admitting: Family Medicine

## 2022-03-30 DIAGNOSIS — N6489 Other specified disorders of breast: Secondary | ICD-10-CM

## 2022-07-03 ENCOUNTER — Encounter: Payer: Self-pay | Admitting: Podiatry

## 2022-07-03 ENCOUNTER — Ambulatory Visit: Payer: BC Managed Care – PPO | Admitting: Podiatry

## 2022-07-03 ENCOUNTER — Telehealth: Payer: Self-pay | Admitting: Podiatry

## 2022-07-03 DIAGNOSIS — Z79899 Other long term (current) drug therapy: Secondary | ICD-10-CM

## 2022-07-03 DIAGNOSIS — B351 Tinea unguium: Secondary | ICD-10-CM

## 2022-07-03 MED ORDER — DOXYCYCLINE HYCLATE 100 MG PO TABS: 100.0000 mg | ORAL_TABLET | Freq: Two times a day (BID) | ORAL | 0 refills | Status: DC

## 2022-07-03 NOTE — Telephone Encounter (Signed)
Pt called and states her prescription went to a pharmacy in [place] and she would like it sent to:  [id number] - [name], [address] AT [address]  doxycycline (VIBRA-TABS) 100 MG tablet  Please advise

## 2022-07-03 NOTE — Progress Notes (Signed)
Subjective:  Patient ID: Erin Salinas, female    DOB: 09-04-1961,  MRN: [id number]  Chief Complaint  Patient presents with   Nail Problem    Nail discoloration.. possible fungus     61 y.o. female presents with the above complaint.  Patient presents with left hallux nail discoloration.  Patient states that there is a discoloration.  She states that it has been repsent for quite some time.  She does not know how she got it.  She wanted get it evaluated she would like to get treatment for both the fungus and bacterial component to it.  0 out of 10 pain scale.  She has tried cutting it down which does not make it significantly better.   Review of Systems: Negative except as noted in the HPI. Denies N/V/F/Ch.  Past Medical History:  Diagnosis Date   Anxiety    Asthma    Endometriosis    Fibroid    History of kidney stones    HTN (hypertension)    Palpitations    Perimenopause    Vertigo     Current Outpatient Medications:    doxycycline (VIBRA-TABS) 100 MG tablet, Take 1 tablet (100 mg total) by mouth 2 (two) times daily., Disp: 60 tablet, Rfl: 0   amLODipine (NORVASC) 5 MG tablet, 1 tablet, Disp: , Rfl:    Baloxavir Marboxil,80 MG Dose, (XOFLUZA, 80 MG DOSE,) 1 x 80 MG TBPK, Xofluza 80 mg tablet  Take 1 tablet by oral route for 1 day., Disp: , Rfl:    Chlophedianol-Pyrilamine (NINJACOF) 12.5-12.5 MG/5ML LIQD, Ninjacof 12.5 mg-12.5 mg/5 mL oral liquid  Take 10 mL every 8 hours by oral route as needed., Disp: , Rfl:    Cyanocobalamin (VITAMIN B-12 PO), Take 1 tablet by mouth daily., Disp: , Rfl:    estradiol (ESTRACE) 0.5 MG tablet, 1 tablet, Disp: , Rfl:    Estradiol 10 MCG TABS vaginal tablet, Insert one tab intravaginally twice weekly., Disp: 24 tablet, Rfl: 0   Ferrous Sulfate (IRON) 325 (65 Fe) MG TABS, 1 tablet, Disp: , Rfl:    ibuprofen (ADVIL) 800 MG tablet, Take 1 tablet (800 mg total) by mouth every 8 (eight) hours as needed., Disp: 30 tablet, Rfl: 0   IRON PO, Take 1  tablet by mouth daily., Disp: , Rfl:    methylPREDNISolone (MEDROL DOSEPAK) 4 MG TBPK tablet, Take by mouth as directed., Disp: , Rfl:    Omega-3 Fatty Acids (FISH OIL PO), Take 1 tablet by mouth daily., Disp: , Rfl:    ondansetron (ZOFRAN ODT) 4 MG disintegrating tablet, Take 1 tablet (4 mg total) by mouth every 8 (eight) hours as needed for nausea. (Patient not taking: Reported on 11/30/2018), Disp: 20 tablet, Rfl: 0   oseltamivir (TAMIFLU) 75 MG capsule, Tamiflu 75 mg capsule  Take 1 capsule twice a day by oral route., Disp: , Rfl:    predniSONE (DELTASONE) 20 MG tablet, Take by mouth., Disp: , Rfl:    scopolamine (TRANSDERM-SCOP) 1 MG/3DAYS, 1 patch to skin as needed, Disp: , Rfl:    triamcinolone cream (KENALOG) 0.1 %, Apply topically 2 (two) times daily., Disp: , Rfl:    valACYclovir (VALTREX) 1000 MG tablet, Take 1,000 mg by mouth 3 (three) times daily., Disp: , Rfl:    vitamin B-12 (CYANOCOBALAMIN) 1000 MCG tablet, 1 tablet, Disp: , Rfl:    VITAMIN D PO, Take 1 tablet by mouth daily., Disp: , Rfl:    Zinc 50 MG TABS, 1 tablet, Disp: ,  Rfl:   Social History   Tobacco Use  Smoking Status Never  Smokeless Tobacco Never    Allergies  Allergen Reactions   Codeine    Other     Other reaction(s): vomiting   Objective:  There were no vitals filed for this visit. There is no height or weight on file to calculate BMI. Constitutional Well developed. Well nourished.  Vascular Dorsalis pedis pulses palpable bilaterally. Posterior tibial pulses palpable bilaterally. Capillary refill normal to all digits.  No cyanosis or clubbing noted. Pedal hair growth normal.  Neurologic Normal speech. Oriented to person, place, and time. Epicritic sensation to light touch grossly present bilaterally.  Dermatologic Nails thickened elongated dystrophic mycotic nail x2 bilateral hallux mostly to the left hallux Skin within normal limits  Orthopedic: Normal joint ROM without pain or crepitus  bilaterally. No visible deformities. No bony tenderness.   Radiographs: None Assessment:   1. Long-term use of high-risk medication   2. Nail fungus   3. Onychomycosis due to dermatophyte    Plan:  Patient was evaluated and treated and all questions answered.  Left hallux onychomycosis/bacterial growth -Educated the patient on the etiology of onychomycosis and various treatment options associated with improving the fungal load.  I explained to the patient that there is 3 treatment options available to treat the onychomycosis including topical, p.o., laser treatment.  Patient elected to undergo p.o. options with Lamisil/terbinafine therapy.  In order for me to start the medication therapy, I explained to the patient the importance of evaluating the liver and obtaining the liver function test.  Once the liver function test comes back normal I will start him on 89-monthcourse of Lamisil therapy.  Patient understood all risk and would like to proceed with Lamisil therapy.  I have asked the patient to immediately stop the Lamisil therapy if she has any reactions to it and call the office or go to the emergency room right away.  Patient states understanding -I will also place her on doxycycline for 30 days to the bacterial component of it.  For help with  No follow-ups on file.

## 2022-07-03 NOTE — Addendum Note (Signed)
Addended by: Boneta Lucks on: 07/03/2022 01:48 PM   Modules accepted: Orders

## 2022-07-06 ENCOUNTER — Other Ambulatory Visit: Payer: Self-pay | Admitting: Podiatry

## 2022-07-07 ENCOUNTER — Telehealth: Payer: Self-pay | Admitting: *Deleted

## 2022-07-07 LAB — HEPATIC FUNCTION PANEL
ALT: 19 IU/L (ref 0–32)
AST: 23 IU/L (ref 0–40)
Albumin: 4.6 g/dL (ref 3.8–4.9)
Alkaline Phosphatase: 134 IU/L — ABNORMAL HIGH (ref 44–121)
Bilirubin Total: 0.3 mg/dL (ref 0.0–1.2)
Bilirubin, Direct: 0.11 mg/dL (ref 0.00–0.40)
Total Protein: 6.9 g/dL (ref 6.0–8.5)

## 2022-07-09 MED ORDER — TERBINAFINE HCL 250 MG PO TABS: 250.0000 mg | ORAL_TABLET | Freq: Every day | ORAL | 0 refills | Status: DC

## 2022-07-09 NOTE — Addendum Note (Signed)
Addended by: Boneta Lucks on: 07/09/2022 08:26 AM   Modules accepted: Orders

## 2022-07-11 NOTE — Telephone Encounter (Signed)
error 

## 2022-07-13 ENCOUNTER — Telehealth: Payer: Self-pay | Admitting: Podiatry

## 2022-07-13 ENCOUNTER — Other Ambulatory Visit: Payer: Self-pay | Admitting: Podiatry

## 2022-07-13 MED ORDER — TERBINAFINE HCL 250 MG PO TABS
250.0000 mg | ORAL_TABLET | Freq: Every day | ORAL | 0 refills | Status: DC
Start: 2022-07-13 — End: 2023-04-28

## 2022-07-13 NOTE — Telephone Encounter (Signed)
Pt called asking about her Liver results. Pt said shes been waiting for results  Please Advise

## 2022-07-27 ENCOUNTER — Other Ambulatory Visit (HOSPITAL_COMMUNITY): Payer: Self-pay | Admitting: Family Medicine

## 2022-07-27 DIAGNOSIS — R011 Cardiac murmur, unspecified: Secondary | ICD-10-CM

## 2022-07-27 DIAGNOSIS — R002 Palpitations: Secondary | ICD-10-CM

## 2022-08-03 ENCOUNTER — Encounter (HOSPITAL_COMMUNITY): Payer: Self-pay | Admitting: Family Medicine

## 2022-08-10 ENCOUNTER — Telehealth (HOSPITAL_COMMUNITY): Payer: Self-pay | Admitting: Family Medicine

## 2022-08-10 NOTE — Telephone Encounter (Signed)
Just an FYI. We have made several attempts to contact this patient including sending a letter to schedule or reschedule their echocardiogram. We will be removing the patient from the echo WQ.   MAILED LETTER [facility] 08/03/22 [facility] to schedule x 3 @ 2:36/LBW 07/30/22 [facility] to schedule @ 9:37/LBW 07/27/22 [facility] to schedule @ 12:01/LBW        Thank you

## 2022-10-27 ENCOUNTER — Ambulatory Visit: Payer: BC Managed Care – PPO | Admitting: Podiatry

## 2022-10-27 VITALS — BP 140/78

## 2022-10-27 DIAGNOSIS — Z79899 Other long term (current) drug therapy: Secondary | ICD-10-CM | POA: Diagnosis not present

## 2022-10-27 DIAGNOSIS — B351 Tinea unguium: Secondary | ICD-10-CM | POA: Diagnosis not present

## 2022-10-27 NOTE — Progress Notes (Signed)
Subjective:  Patient ID: Erin Salinas, female    DOB: August 02, 1961,  MRN: [id number]  No chief complaint on file.   62 y.o. female presents with the above complaint.  Patient presents with follow-up to left nail discoloration.  Lamisil has helped a little bit.  She would like to discuss next treatment plan she has completed the course no adverse reactions   Review of Systems: Negative except as noted in the HPI. Denies N/V/F/Ch.  Past Medical History:  Diagnosis Date   Anxiety    Asthma    Endometriosis    Fibroid    History of kidney stones    HTN (hypertension)    Palpitations    Perimenopause    Vertigo     Current Outpatient Medications:    amLODipine (NORVASC) 5 MG tablet, 1 tablet, Disp: , Rfl:    Baloxavir Marboxil,80 MG Dose, (XOFLUZA, 80 MG DOSE,) 1 x 80 MG TBPK, Xofluza 80 mg tablet  Take 1 tablet by oral route for 1 day., Disp: , Rfl:    Chlophedianol-Pyrilamine (NINJACOF) 12.5-12.5 MG/5ML LIQD, Ninjacof 12.5 mg-12.5 mg/5 mL oral liquid  Take 10 mL every 8 hours by oral route as needed., Disp: , Rfl:    Cyanocobalamin (VITAMIN B-12 PO), Take 1 tablet by mouth daily., Disp: , Rfl:    doxycycline (VIBRA-TABS) 100 MG tablet, Take 1 tablet (100 mg total) by mouth 2 (two) times daily., Disp: 60 tablet, Rfl: 0   estradiol (ESTRACE) 0.5 MG tablet, 1 tablet, Disp: , Rfl:    Estradiol 10 MCG TABS vaginal tablet, Insert one tab intravaginally twice weekly., Disp: 24 tablet, Rfl: 0   Ferrous Sulfate (IRON) 325 (65 Fe) MG TABS, 1 tablet, Disp: , Rfl:    ibuprofen (ADVIL) 800 MG tablet, Take 1 tablet (800 mg total) by mouth every 8 (eight) hours as needed., Disp: 30 tablet, Rfl: 0   IRON PO, Take 1 tablet by mouth daily., Disp: , Rfl:    methylPREDNISolone (MEDROL DOSEPAK) 4 MG TBPK tablet, Take by mouth as directed., Disp: , Rfl:    Omega-3 Fatty Acids (FISH OIL PO), Take 1 tablet by mouth daily., Disp: , Rfl:    ondansetron (ZOFRAN ODT) 4 MG disintegrating tablet, Take 1 tablet  (4 mg total) by mouth every 8 (eight) hours as needed for nausea. (Patient not taking: Reported on 11/30/2018), Disp: 20 tablet, Rfl: 0   oseltamivir (TAMIFLU) 75 MG capsule, Tamiflu 75 mg capsule  Take 1 capsule twice a day by oral route., Disp: , Rfl:    predniSONE (DELTASONE) 20 MG tablet, Take by mouth., Disp: , Rfl:    scopolamine (TRANSDERM-SCOP) 1 MG/3DAYS, 1 patch to skin as needed, Disp: , Rfl:    terbinafine (LAMISIL) 250 MG tablet, Take 1 tablet (250 mg total) by mouth daily., Disp: 90 tablet, Rfl: 0   terbinafine (LAMISIL) 250 MG tablet, Take 1 tablet (250 mg total) by mouth daily., Disp: 90 tablet, Rfl: 0   terbinafine (LAMISIL) 250 MG tablet, Take 1 tablet (250 mg total) by mouth daily., Disp: 90 tablet, Rfl: 0   triamcinolone cream (KENALOG) 0.1 %, Apply topically 2 (two) times daily., Disp: , Rfl:    valACYclovir (VALTREX) 1000 MG tablet, Take 1,000 mg by mouth 3 (three) times daily., Disp: , Rfl:    vitamin B-12 (CYANOCOBALAMIN) 1000 MCG tablet, 1 tablet, Disp: , Rfl:    VITAMIN D PO, Take 1 tablet by mouth daily., Disp: , Rfl:    Zinc 50 MG TABS, 1  tablet, Disp: , Rfl:   Social History   Tobacco Use  Smoking Status Never  Smokeless Tobacco Never    Allergies  Allergen Reactions   Codeine    Other     Other reaction(s): vomiting   Objective:   Vitals:   10/27/22 1446  BP: (!) 140/78   There is no height or weight on file to calculate BMI. Constitutional Well developed. Well nourished.  Vascular Dorsalis pedis pulses palpable bilaterally. Posterior tibial pulses palpable bilaterally. Capillary refill normal to all digits.  No cyanosis or clubbing noted. Pedal hair growth normal.  Neurologic Normal speech. Oriented to person, place, and time. Epicritic sensation to light touch grossly present bilaterally.  Dermatologic Nails thickened elongated dystrophic mycotic nail x2 bilateral hallux mostly to the left hallux Skin within normal limits  Orthopedic: Normal  joint ROM without pain or crepitus bilaterally. No visible deformities. No bony tenderness.   Radiographs: None Assessment:   1. Long-term use of high-risk medication    Plan:  Patient was evaluated and treated and all questions answered.  Left hallux onychomycosis/bacterial growth~second round -Educated the patient on the etiology of onychomycosis and various treatment options associated with improving the fungal load.  I explained to the patient that there is 3 treatment options available to treat the onychomycosis including topical, p.o., laser treatment.  Patient elected to undergo p.o. options with second round of Lamisil/terbinafine therapy.  In order for me to start the medication therapy, I explained to the patient the importance of evaluating the liver and obtaining the liver function test.  Once the liver function test comes back normal I will start him on second round of 48-monthcourse of Lamisil therapy.  Patient understood all risk and would like to proceed with Lamisil therapy.  I have asked the patient to immediately stop the Lamisil therapy if she has any reactions to it and call the office or go to the emergency room right away.  Patient states understanding -I will also place her on doxycycline for 30 days to the bacterial component of it.  For help with  No follow-ups on file.

## 2022-11-10 ENCOUNTER — Encounter: Payer: BC Managed Care – PPO | Admitting: Nurse Practitioner

## 2022-11-26 LAB — HEPATIC FUNCTION PANEL
AG Ratio: 1.7 (calc) (ref 1.0–2.5)
ALT: 15 U/L (ref 6–29)
AST: 18 U/L (ref 10–35)
Albumin: 4.5 g/dL (ref 3.6–5.1)
Alkaline phosphatase (APISO): 113 U/L (ref 37–153)
Bilirubin, Direct: 0.1 mg/dL (ref 0.0–0.2)
Globulin: 2.7 g/dL (calc) (ref 1.9–3.7)
Indirect Bilirubin: 0.3 mg/dL (calc) (ref 0.2–1.2)
Total Bilirubin: 0.4 mg/dL (ref 0.2–1.2)
Total Protein: 7.2 g/dL (ref 6.1–8.1)

## 2022-11-26 MED ORDER — TERBINAFINE HCL 250 MG PO TABS: 250.0000 mg | ORAL_TABLET | Freq: Every day | ORAL | 0 refills | Status: DC

## 2022-11-26 NOTE — Addendum Note (Signed)
Addended by: Boneta Lucks on: 11/26/2022 09:21 AM   Modules accepted: Orders

## 2022-12-02 ENCOUNTER — Encounter: Payer: BC Managed Care – PPO | Admitting: Radiology

## 2023-03-02 ENCOUNTER — Other Ambulatory Visit: Payer: Self-pay | Admitting: Family Medicine

## 2023-03-02 DIAGNOSIS — Z1231 Encounter for screening mammogram for malignant neoplasm of breast: Secondary | ICD-10-CM

## 2023-04-06 ENCOUNTER — Ambulatory Visit
Admission: RE | Admit: 2023-04-06 | Discharge: 2023-04-06 | Disposition: A | Discharge status: Home / Self Care | Payer: BC Managed Care – PPO | Source: Ambulatory Visit | Attending: Family Medicine | Admitting: Family Medicine

## 2023-04-06 ENCOUNTER — Other Ambulatory Visit: Payer: Self-pay | Admitting: Family Medicine

## 2023-04-06 DIAGNOSIS — Z1231 Encounter for screening mammogram for malignant neoplasm of breast: Secondary | ICD-10-CM

## 2023-04-28 ENCOUNTER — Ambulatory Visit: Payer: BC Managed Care – PPO | Admitting: Nurse Practitioner

## 2023-04-28 ENCOUNTER — Encounter: Payer: Self-pay | Admitting: Nurse Practitioner

## 2023-04-28 VITALS — BP 128/82 | HR 78 | Ht 67.0 in | Wt 227.0 lb

## 2023-04-28 DIAGNOSIS — N952 Postmenopausal atrophic vaginitis: Secondary | ICD-10-CM

## 2023-04-28 DIAGNOSIS — N951 Menopausal and female climacteric states: Secondary | ICD-10-CM

## 2023-04-28 DIAGNOSIS — Z79890 Hormone replacement therapy (postmenopausal): Secondary | ICD-10-CM | POA: Diagnosis not present

## 2023-04-28 MED ORDER — PROGESTERONE MICRONIZED 100 MG PO CAPS: 100.0000 mg | ORAL_CAPSULE | Freq: Every day | ORAL | 0 refills | Status: DC

## 2023-04-28 MED ORDER — ESTRADIOL 0.0375 MG/24HR TD PTWK: 0.0375 mg | MEDICATED_PATCH | TRANSDERMAL | 0 refills | Status: DC

## 2023-04-28 MED ORDER — ESTRADIOL 0.1 MG/GM VA CREA: 1.0000 g | TOPICAL_CREAM | VAGINAL | 0 refills | Status: DC

## 2023-04-28 NOTE — Progress Notes (Signed)
   Acute Office Visit  Subjective:    Patient ID: Erin Salinas, female    DOB: 07/13/1961, 62 y.o.   MRN: [id number]   HPI 62 y.o. [id number] presents as newly established patient to discuss management of menopausal symptoms. Last seen in our office in 2020. Has generalized joint pain that makes it difficult to be active, fatigue, anxiety, insomnia and severe pain with intercourse. Really does not feel like herself. Painful intercourse is affecting intimacy with husband, has bleeding with it. Using silicone-based lubricant.  Benign breast biopsy 03/2022. Mammogram 04/06/23 normal. MGM with history of breast cancer. S/P 2013 TAH for fibroids. Tearful during visit today.   No LMP recorded. Patient has had a hysterectomy.    Review of Systems  Constitutional:  Positive for fatigue.  Genitourinary:  Positive for dyspareunia.  Musculoskeletal:  Positive for arthralgias.  Psychiatric/Behavioral:  Positive for dysphoric mood and sleep disturbance. The patient is nervous/anxious.        Objective:    Physical Exam Constitutional:      Appearance: Normal appearance.     BP 128/82   Pulse 78   Ht 5\' 7"  (1.702 m)   Wt 227 lb (103 kg)   SpO2 100%   BMI 35.55 kg/m  Wt Readings from Last 3 Encounters:  04/28/23 227 lb (103 kg)  11/30/18 200 lb (90.7 kg)  08/25/18 192 lb 6 oz (87.3 kg)        Assessment & Plan:   Problem List Items Addressed This Visit   None Visit Diagnoses     Menopausal symptoms    -  Primary   Relevant Medications   estradiol (CLIMARA) 0.0375 mg/24hr patch   progesterone (PROMETRIUM) 100 MG capsule   Vaginal atrophy       Relevant Medications   estradiol (ESTRACE VAGINAL) 0.1 MG/GM vaginal cream (Start on 04/29/2023)   Postmenopausal hormone therapy       Relevant Medications   estradiol (CLIMARA) 0.0375 mg/24hr patch   progesterone (PROMETRIUM) 100 MG capsule   Menopausal vaginal dryness       Relevant Medications   estradiol (ESTRACE VAGINAL) 0.1  MG/GM vaginal cream (Start on 04/29/2023)      Plan: Management of menopausal symptoms reviewed - supplements, SSRIs, HRT. Would like start HRT. Estradiol patch 0.375 mg weekly. Prometrium 100 mg nightly for sleep and mood. Would like to start vaginal estrogen due to severe symptoms. Use nightly x 2 weeks, then twice weekly. Educated on benefits of cardiac, bone and brain health, symptom management. Risk for blood clots, heart attack and stroke. Verbalized understanding. Will return in 6 weeks for annual and follow up.      Olivia Mackie DNP, 9:48 AM 04/28/2023

## 2023-04-30 ENCOUNTER — Other Ambulatory Visit: Payer: Self-pay | Admitting: Nurse Practitioner

## 2023-04-30 DIAGNOSIS — N951 Menopausal and female climacteric states: Secondary | ICD-10-CM

## 2023-04-30 DIAGNOSIS — N952 Postmenopausal atrophic vaginitis: Secondary | ICD-10-CM

## 2023-04-30 NOTE — Telephone Encounter (Addendum)
Rx for estradiol 0.01% vaginal cream also came thru fax. Pharmacy needed directions & days supply for rx that was sent in yesterday. I wrote directions from rx that was sent in & faxed form. This rx that came thru today denied. I also called the pharmacy & verified that they had the rx correct & patient is aware they are getting it ready for her.

## 2023-06-10 ENCOUNTER — Encounter: Payer: Self-pay | Admitting: Nurse Practitioner

## 2023-06-10 ENCOUNTER — Ambulatory Visit (INDEPENDENT_AMBULATORY_CARE_PROVIDER_SITE_OTHER): Payer: BC Managed Care – PPO | Admitting: Nurse Practitioner

## 2023-06-10 VITALS — BP 128/72 | HR 66 | Wt 225.0 lb

## 2023-06-10 DIAGNOSIS — Z01419 Encounter for gynecological examination (general) (routine) without abnormal findings: Secondary | ICD-10-CM | POA: Diagnosis not present

## 2023-06-10 DIAGNOSIS — Z79890 Hormone replacement therapy (postmenopausal): Secondary | ICD-10-CM | POA: Diagnosis not present

## 2023-06-10 DIAGNOSIS — N951 Menopausal and female climacteric states: Secondary | ICD-10-CM | POA: Diagnosis not present

## 2023-06-10 DIAGNOSIS — R1032 Left lower quadrant pain: Secondary | ICD-10-CM | POA: Diagnosis not present

## 2023-06-10 MED ORDER — ESTRADIOL 0.0375 MG/24HR TD PTWK: 0.0375 mg | MEDICATED_PATCH | TRANSDERMAL | 3 refills | Status: DC

## 2023-06-10 MED ORDER — PROGESTERONE MICRONIZED 100 MG PO CAPS
100.0000 mg | ORAL_CAPSULE | Freq: Every day | ORAL | 1 refills | Status: DC
  Filled 2024-01-13: qty 90, 90d supply, fill #0
  Filled 2024-04-07: qty 90, 90d supply, fill #1

## 2023-06-10 NOTE — Progress Notes (Signed)
Erin Salinas 14-Aug-1961 [id number]   History:  62 y.o. [id number] presents for annual exam. Complains of left lower quadrant pain for months. Intermittent, achy, random, but worse with lying down. Xray of pelvis and bilateral hips negative. H/O ovarian cysts. Postmenopausal - started HRT 6 weeks ago for management of generalized joint pain, fatigue, anxiety, insomnia and severe pain with intercourse. Using vaginal estrogen. Has had improvement in all symptoms. Very happy with it. Normal pap history. S/P 2013 TAH. Benign breast biopsy 03/2022.  Gynecologic History No LMP recorded. Patient has had a hysterectomy.   Contraception/Family planning: status post hysterectomy Sexually active: Yes  Health Maintenance Last Pap: Prior to hysterectomy Last mammogram: 04/06/2023. Results were: Normal Last colonoscopy: 2019 Last Dexa: Not indicated  Past medical history, past surgical history, family history and social history were all reviewed and documented in the EPIC chart. 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   ROS:  A ROS was performed and pertinent positives and negatives are included.  Exam:  Vitals:   06/10/23 1521  BP: 128/72  Pulse: 66  SpO2: 100%  Weight: 225 lb (102.1 kg)   Body mass index is 35.24 kg/m.  General appearance:  Normal Thyroid:  Symmetrical, normal in size, without palpable masses or nodularity. Respiratory  Auscultation:  Clear without wheezing or rhonchi Cardiovascular  Auscultation:  Regular rate, without rubs, murmurs or gallops  Edema/varicosities:  Not grossly evident Abdominal  Soft,nontender, without masses, guarding or rebound.  Liver/spleen:  No organomegaly noted  Hernia:  None appreciated  Skin  Inspection:  Grossly normal Breasts: Examined lying and sitting.   Right: Without masses, retractions, nipple discharge or axillary adenopathy.   Left: Without masses, retractions, nipple discharge or axillary adenopathy. Pelvic: External  genitalia:  no lesions              Urethra:  normal appearing urethra with no masses, tenderness or lesions              Bartholins and Skenes: normal                 Vagina: normal appearing vagina with normal color and discharge, no lesions              Cervix: absent Bimanual Exam:  Uterus:  absent              Adnexa: no mass, fullness, tenderness              Rectovaginal: Deferred              Anus:  normal, no lesions   Patient informed chaperone available to be present for breast and pelvic exam. Patient has requested no chaperone to be present. Patient has been advised what will be completed during breast and pelvic exam.   Assessment/Plan:  62 y.o. [id number] for annual exam.   Well female exam with routine gynecological exam - Education provided on SBEs, importance of preventative screenings, current guidelines, high calcium diet, regular exercise, and multivitamin daily. Labs with PCP.   Postmenopausal hormone therapy - Plan: estradiol (CLIMARA) 0.0375 mg/24hr patch weekly, progesterone (PROMETRIUM) 100 MG capsule nightly. Great management.   Menopausal vaginal dryness - Much improvement with vaginal estrogen.   Left lower quadrant abdominal pain - Plan: US PELVIC COMPLETE WITH TRANSVAGINAL. Aware we are not doing [place] in office. Information provided on local imaging center. If normal, will follow up with PCP.   Screening for cervical cancer - Normal Pap history. No longer screening  per guidelines.   Screening for breast cancer - Continue annual screenings.  Normal breast exam today.  Screening for colon cancer - 2019 colonoscopy. Will repeat at 5-year interval per GI's recommendation. Plans to call to get this scheduled.   Screening for osteoporosis - Average risk. Will plan DXA at age 28.   Return in 1 year for annual or sooner if needed.     Olivia Mackie DNP, 3:50 PM 06/10/2023

## 2023-06-23 ENCOUNTER — Ambulatory Visit (HOSPITAL_COMMUNITY)
Admission: RE | Admit: 2023-06-23 | Discharge: 2023-06-23 | Disposition: A | Discharge status: Home / Self Care | Payer: BC Managed Care – PPO | Source: Ambulatory Visit | Attending: Nurse Practitioner | Admitting: Nurse Practitioner

## 2023-06-23 DIAGNOSIS — R1032 Left lower quadrant pain: Secondary | ICD-10-CM | POA: Diagnosis present

## 2023-06-24 ENCOUNTER — Encounter: Payer: Self-pay | Admitting: Nurse Practitioner

## 2023-06-24 ENCOUNTER — Ambulatory Visit (HOSPITAL_COMMUNITY): Payer: BC Managed Care – PPO

## 2023-07-08 NOTE — Telephone Encounter (Signed)
Call placed to [facility] Reading room at [phone], spoke with MJ. Requested report be expedited.   Routing to provider FYI.   Encounter closed.

## 2023-11-06 ENCOUNTER — Other Ambulatory Visit (HOSPITAL_BASED_OUTPATIENT_CLINIC_OR_DEPARTMENT_OTHER): Payer: Self-pay

## 2023-11-06 MED ORDER — TRETINOIN 0.05 % EX CREA
TOPICAL_CREAM | Freq: Every day | CUTANEOUS | 3 refills | Status: AC
  Filled 2023-11-08: qty 20, 28d supply, fill #0
  Filled 2024-01-20: qty 20, 30d supply, fill #0

## 2023-11-06 MED ORDER — CETIRIZINE HCL 10 MG PO TABS
10.0000 mg | ORAL_TABLET | Freq: Every day | ORAL | 0 refills | Status: DC
  Filled 2023-11-21: qty 30, 30d supply, fill #0

## 2023-11-06 MED ORDER — TIRZEPATIDE-WEIGHT MANAGEMENT 5 MG/0.5ML ~~LOC~~ SOAJ
5.0000 mg | SUBCUTANEOUS | 0 refills | Status: DC
  Filled 2023-11-19: qty 2, 28d supply, fill #0

## 2023-11-06 MED ORDER — SEMAGLUTIDE-WEIGHT MANAGEMENT 0.25 MG/0.5ML ~~LOC~~ SOAJ: 0.2500 mg | SUBCUTANEOUS | 1 refills | Status: DC

## 2023-11-08 ENCOUNTER — Other Ambulatory Visit (HOSPITAL_BASED_OUTPATIENT_CLINIC_OR_DEPARTMENT_OTHER): Payer: Self-pay

## 2023-11-08 MED ORDER — TRETINOIN 0.05 % EX CREA
1.0000 | TOPICAL_CREAM | Freq: Every evening | CUTANEOUS | 3 refills | Status: DC
Start: 2023-11-08 — End: 2024-06-19
  Filled 2023-11-08: qty 20, 30d supply, fill #0
  Filled 2024-01-20: qty 20, fill #0
  Filled 2024-03-12: qty 20, 30d supply, fill #0

## 2023-11-15 ENCOUNTER — Other Ambulatory Visit: Payer: Self-pay

## 2023-11-15 DIAGNOSIS — Z79890 Hormone replacement therapy (postmenopausal): Secondary | ICD-10-CM

## 2023-11-15 NOTE — Telephone Encounter (Signed)
 Med refill request: Climara 0.0375mg  patch Last AEX: 06/10/23 Next AEX: not scheduled  Last MMG (if hormonal med) 04/06/23 birads cat 1 neg  Refill authorized: Last rx 06/10/23 #24 with 3 refills. Please approve or deny as appropriate.

## 2023-11-19 ENCOUNTER — Other Ambulatory Visit (HOSPITAL_BASED_OUTPATIENT_CLINIC_OR_DEPARTMENT_OTHER): Payer: Self-pay

## 2023-11-19 NOTE — Telephone Encounter (Signed)
 Pt called and left VM inquiring about rx being denied. Pt states she is almost out and needs a refill. Pt also said that she needs to change her pharmacy to [facility] [place]. I called the pharmacy where original RX was sent and they let me know that she has 2 refills left and it something messed up on their end and they have resolved this issue and would be happy to refill for her.   Called and left pt a voicemail and asked her to call back so information can be relayed to her about refills and how she will need to go to her original pharmacy and ask them to transfer the rx for her.

## 2023-11-22 ENCOUNTER — Other Ambulatory Visit (HOSPITAL_BASED_OUTPATIENT_CLINIC_OR_DEPARTMENT_OTHER): Payer: Self-pay

## 2023-11-25 ENCOUNTER — Other Ambulatory Visit (HOSPITAL_BASED_OUTPATIENT_CLINIC_OR_DEPARTMENT_OTHER): Payer: Self-pay

## 2023-11-25 MED ORDER — ALBUTEROL SULFATE HFA 108 (90 BASE) MCG/ACT IN AERS
2.0000 | INHALATION_SPRAY | Freq: Four times a day (QID) | RESPIRATORY_TRACT | 0 refills | Status: AC
  Filled 2023-11-25: qty 6.7, 17d supply, fill #0

## 2023-11-25 MED ORDER — CETIRIZINE HCL 10 MG PO TABS
10.0000 mg | ORAL_TABLET | Freq: Every day | ORAL | 0 refills | Status: AC
  Filled 2023-11-25 – 2023-12-17 (×2): qty 30, 30d supply, fill #0

## 2023-11-25 MED ORDER — BENZONATATE 200 MG PO CAPS
200.0000 mg | ORAL_CAPSULE | Freq: Three times a day (TID) | ORAL | 0 refills | Status: DC
  Filled 2023-11-25: qty 30, 10d supply, fill #0

## 2023-11-28 ENCOUNTER — Ambulatory Visit: Payer: Self-pay

## 2023-12-16 ENCOUNTER — Other Ambulatory Visit (HOSPITAL_BASED_OUTPATIENT_CLINIC_OR_DEPARTMENT_OTHER): Payer: Self-pay

## 2023-12-16 MED ORDER — ZEPBOUND 7.5 MG/0.5ML ~~LOC~~ SOAJ
7.5000 mg | SUBCUTANEOUS | 1 refills | Status: DC
  Filled 2023-12-16: qty 2, 28d supply, fill #0
  Filled 2024-01-13: qty 2, 28d supply, fill #1

## 2023-12-17 ENCOUNTER — Other Ambulatory Visit (HOSPITAL_COMMUNITY): Payer: Self-pay

## 2023-12-17 ENCOUNTER — Other Ambulatory Visit (HOSPITAL_BASED_OUTPATIENT_CLINIC_OR_DEPARTMENT_OTHER): Payer: Self-pay

## 2023-12-17 ENCOUNTER — Other Ambulatory Visit: Payer: Self-pay

## 2023-12-20 ENCOUNTER — Other Ambulatory Visit (HOSPITAL_BASED_OUTPATIENT_CLINIC_OR_DEPARTMENT_OTHER): Payer: Self-pay

## 2023-12-27 ENCOUNTER — Other Ambulatory Visit (HOSPITAL_BASED_OUTPATIENT_CLINIC_OR_DEPARTMENT_OTHER): Payer: Self-pay

## 2023-12-27 ENCOUNTER — Other Ambulatory Visit: Payer: Self-pay | Admitting: Nurse Practitioner

## 2023-12-27 ENCOUNTER — Encounter: Payer: Self-pay | Admitting: Nurse Practitioner

## 2023-12-27 DIAGNOSIS — Z79890 Hormone replacement therapy (postmenopausal): Secondary | ICD-10-CM

## 2023-12-27 MED ORDER — ESTRADIOL 0.5 MG/0.5GM TD GEL
1.0000 | Freq: Every day | TRANSDERMAL | 2 refills | Status: DC
  Filled 2023-12-27: qty 30, 30d supply, fill #0

## 2024-01-10 IMAGING — MG MM DIGITAL SCREENING BILAT W/ TOMO AND CAD
8 series · 8 of 24 positions shown · non-contrast
Comparison: Previous exam(s).

CLINICAL DATA: Screening.

EXAM:
DIGITAL SCREENING BILATERAL MAMMOGRAM WITH TOMOSYNTHESIS AND CAD
TECHNIQUE: Bilateral screening digital craniocaudal and mediolateral oblique
mammograms were obtained. Bilateral screening digital breast
tomosynthesis was performed. The images were evaluated with
computer-aided detection.

[L MLO synth-2D]
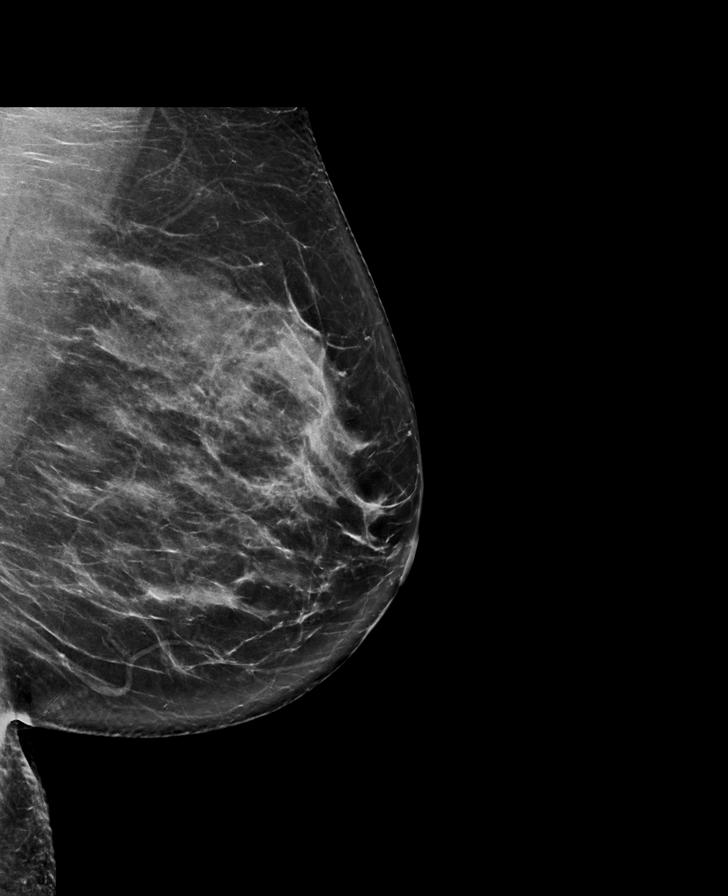

[L CC synth-2D]
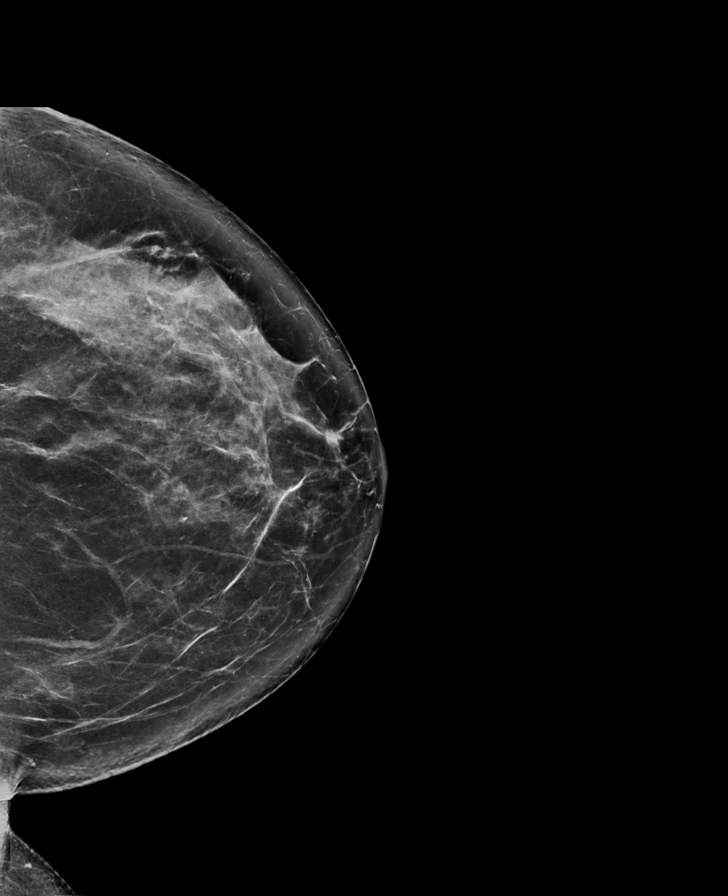

[R CC synth-2D]
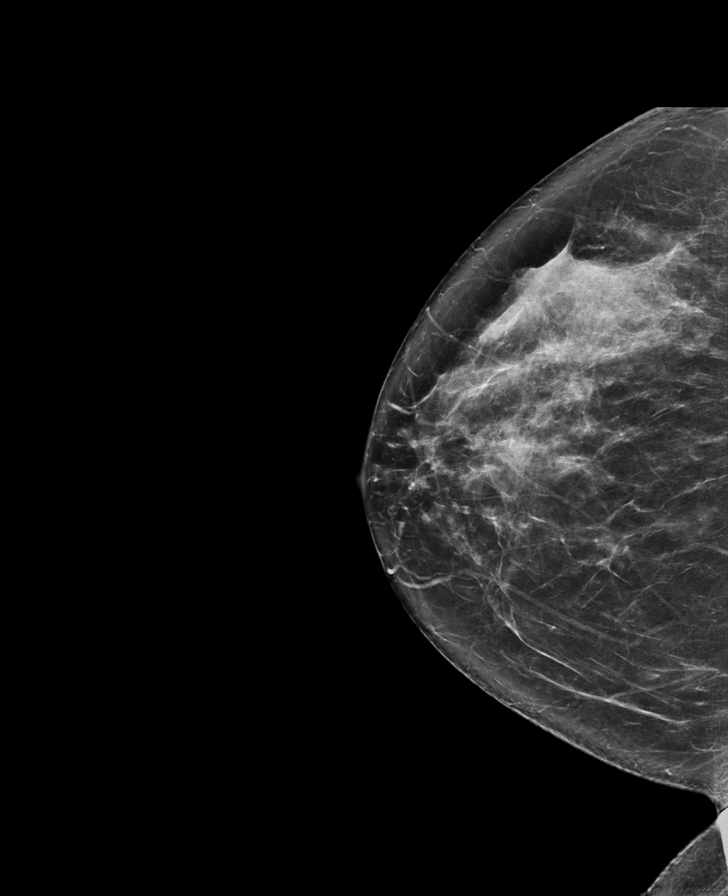

[R MLO synth-2D]
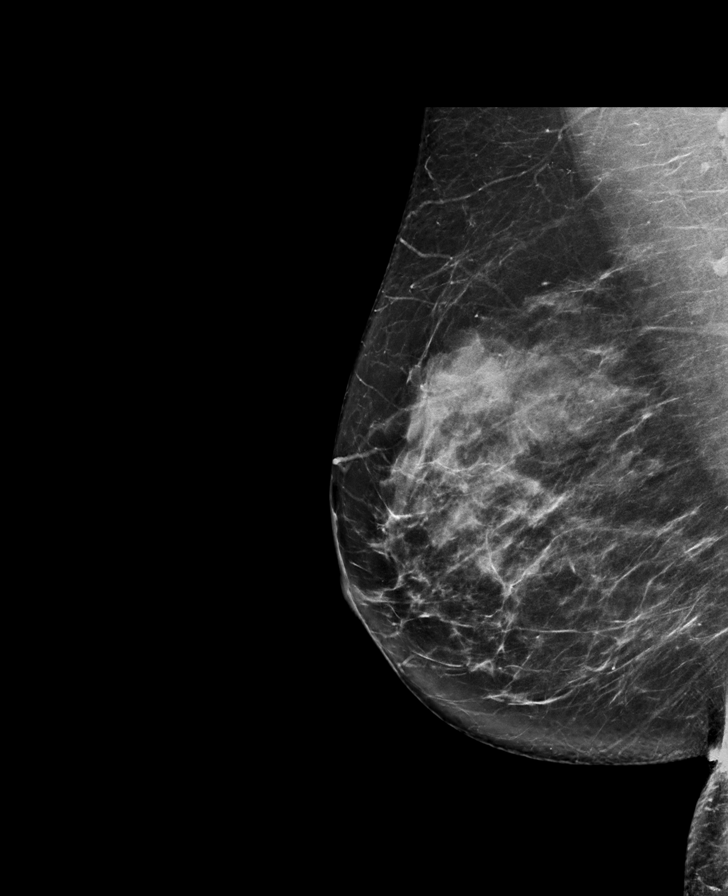

[L CC tomo · tomo slice 43/86.0]
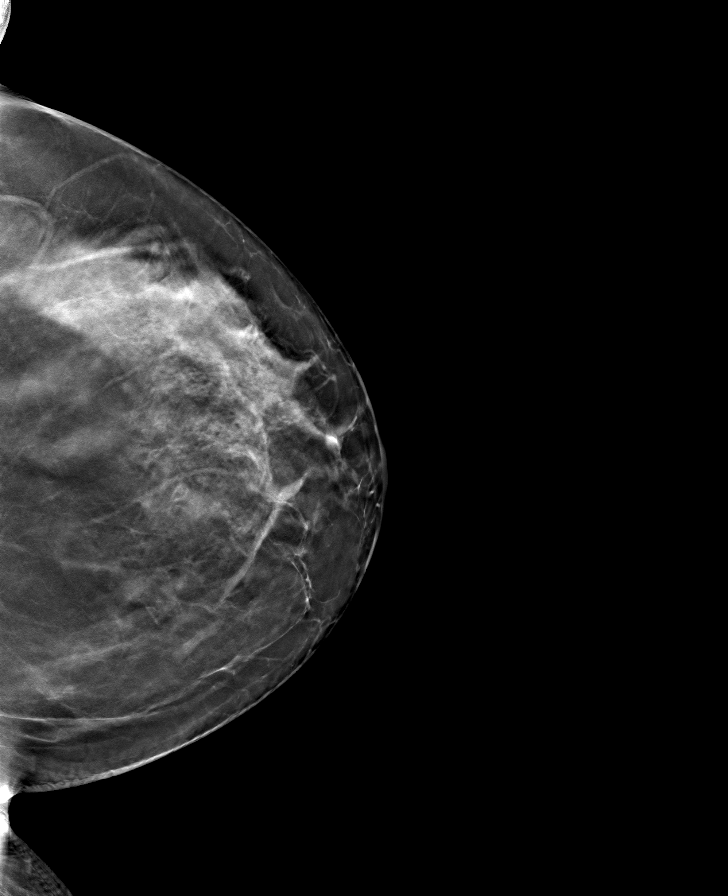

[L MLO tomo · tomo slice 44/87.0]
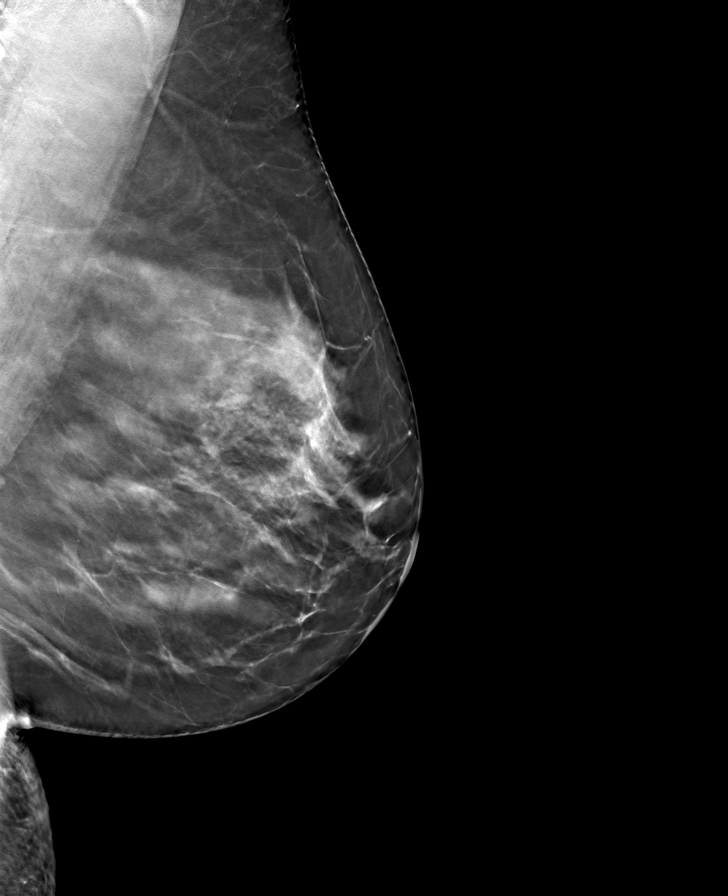

[R MLO tomo · tomo slice 43/86.0]
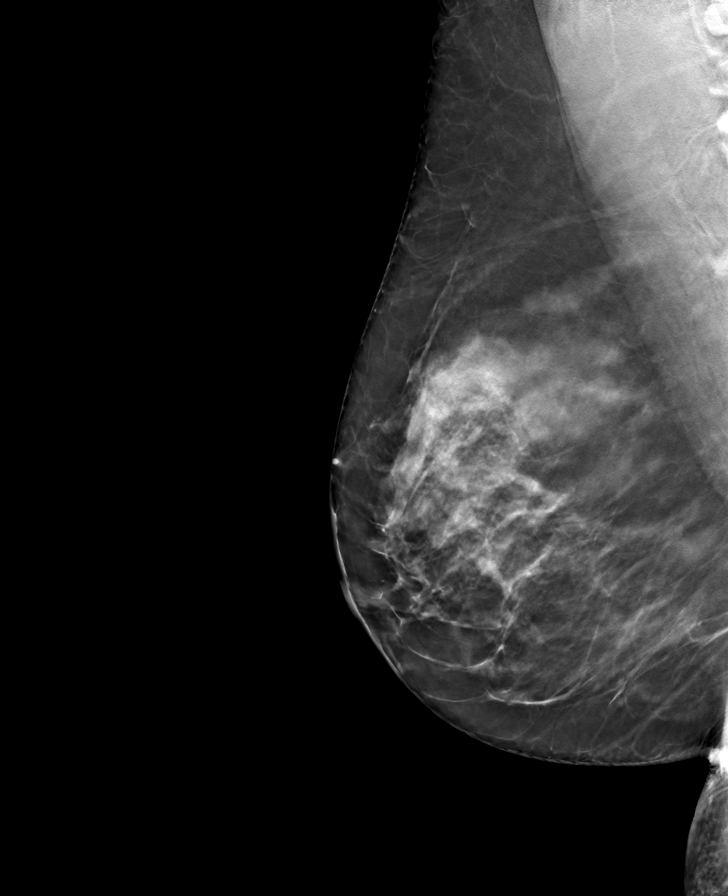

[R CC tomo · tomo slice 43/84.0]
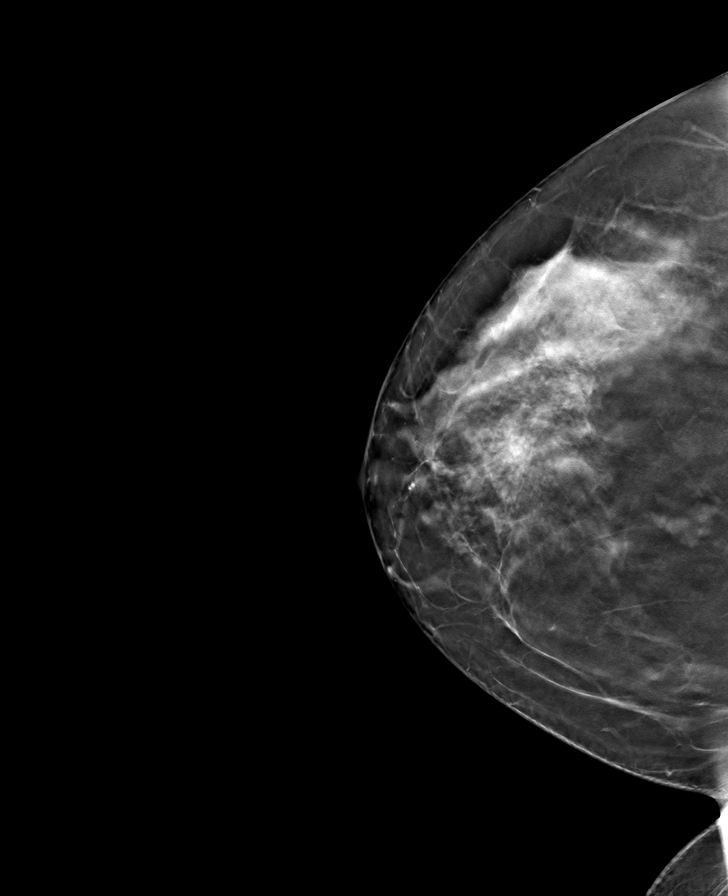

[8 of 24 positions shown; findings below may reference images not displayed]

ACR Breast Density Category c: The breast tissue is heterogeneously
dense, which may obscure small masses.
FINDINGS: In the right breast, a possible asymmetry warrants further
evaluation. In the left breast, no findings suspicious for
malignancy.
IMPRESSION: Further evaluation is suggested for possible asymmetry in the right
breast.

RECOMMENDATION:
Diagnostic mammogram and possibly ultrasound of the right breast.
(Code:[id number])

The patient will be contacted regarding the findings, and additional
imaging will be scheduled.

BI-RADS CATEGORY  0: Incomplete. Need additional imaging evaluation
and/or prior mammograms for comparison.

## 2024-01-13 ENCOUNTER — Other Ambulatory Visit (HOSPITAL_BASED_OUTPATIENT_CLINIC_OR_DEPARTMENT_OTHER): Payer: Self-pay

## 2024-01-13 ENCOUNTER — Other Ambulatory Visit: Payer: Self-pay

## 2024-01-18 ENCOUNTER — Other Ambulatory Visit (HOSPITAL_BASED_OUTPATIENT_CLINIC_OR_DEPARTMENT_OTHER): Payer: Self-pay

## 2024-01-20 ENCOUNTER — Other Ambulatory Visit (HOSPITAL_BASED_OUTPATIENT_CLINIC_OR_DEPARTMENT_OTHER): Payer: Self-pay

## 2024-01-20 ENCOUNTER — Other Ambulatory Visit: Payer: Self-pay

## 2024-01-24 ENCOUNTER — Other Ambulatory Visit: Payer: Self-pay | Admitting: Nurse Practitioner

## 2024-01-24 ENCOUNTER — Other Ambulatory Visit (HOSPITAL_BASED_OUTPATIENT_CLINIC_OR_DEPARTMENT_OTHER): Payer: Self-pay

## 2024-01-24 DIAGNOSIS — Z79890 Hormone replacement therapy (postmenopausal): Secondary | ICD-10-CM

## 2024-01-24 MED ORDER — ESTRADIOL 0.0375 MG/24HR TD PTTW
1.0000 | MEDICATED_PATCH | TRANSDERMAL | 1 refills | Status: DC
  Filled 2024-01-24: qty 24, 84d supply, fill #0
  Filled 2024-03-12 – 2024-04-07 (×2): qty 24, 84d supply, fill #1

## 2024-01-24 NOTE — Telephone Encounter (Signed)
 Previously on Climara  0.0375 mg patch weekly Last AEX 03/10/23 Last MMG 04/06/23: BiRads 1 neg  Routing to Tiffany to review and advise

## 2024-02-15 ENCOUNTER — Other Ambulatory Visit (HOSPITAL_BASED_OUTPATIENT_CLINIC_OR_DEPARTMENT_OTHER): Payer: Self-pay

## 2024-02-18 ENCOUNTER — Other Ambulatory Visit (HOSPITAL_BASED_OUTPATIENT_CLINIC_OR_DEPARTMENT_OTHER): Payer: Self-pay

## 2024-02-18 MED ORDER — ZEPBOUND 10 MG/0.5ML ~~LOC~~ SOAJ
10.0000 mg | SUBCUTANEOUS | 1 refills | Status: DC
  Filled 2024-02-18: qty 2, 28d supply, fill #0
  Filled 2024-03-12: qty 2, 28d supply, fill #1

## 2024-02-21 ENCOUNTER — Other Ambulatory Visit: Payer: Self-pay | Admitting: Family Medicine

## 2024-02-21 DIAGNOSIS — Z1231 Encounter for screening mammogram for malignant neoplasm of breast: Secondary | ICD-10-CM

## 2024-03-04 ENCOUNTER — Other Ambulatory Visit (HOSPITAL_BASED_OUTPATIENT_CLINIC_OR_DEPARTMENT_OTHER): Payer: Self-pay

## 2024-03-13 ENCOUNTER — Other Ambulatory Visit: Payer: Self-pay

## 2024-03-13 ENCOUNTER — Other Ambulatory Visit (HOSPITAL_BASED_OUTPATIENT_CLINIC_OR_DEPARTMENT_OTHER): Payer: Self-pay

## 2024-03-14 ENCOUNTER — Other Ambulatory Visit (HOSPITAL_BASED_OUTPATIENT_CLINIC_OR_DEPARTMENT_OTHER): Payer: Self-pay

## 2024-03-17 ENCOUNTER — Other Ambulatory Visit (HOSPITAL_BASED_OUTPATIENT_CLINIC_OR_DEPARTMENT_OTHER): Payer: Self-pay

## 2024-04-19 ENCOUNTER — Other Ambulatory Visit (HOSPITAL_BASED_OUTPATIENT_CLINIC_OR_DEPARTMENT_OTHER): Payer: Self-pay

## 2024-04-19 ENCOUNTER — Other Ambulatory Visit: Payer: Self-pay

## 2024-04-19 MED ORDER — ZEPBOUND 12.5 MG/0.5ML ~~LOC~~ SOAJ
12.5000 mg | SUBCUTANEOUS | 1 refills | Status: DC
  Filled 2024-04-19: qty 2, 28d supply, fill #0

## 2024-04-20 ENCOUNTER — Other Ambulatory Visit (HOSPITAL_BASED_OUTPATIENT_CLINIC_OR_DEPARTMENT_OTHER): Payer: Self-pay

## 2024-04-20 ENCOUNTER — Ambulatory Visit
Admission: RE | Admit: 2024-04-20 | Discharge: 2024-04-20 | Disposition: A | Discharge status: Home / Self Care | Source: Ambulatory Visit | Attending: Family Medicine | Admitting: Family Medicine

## 2024-04-20 DIAGNOSIS — Z1231 Encounter for screening mammogram for malignant neoplasm of breast: Secondary | ICD-10-CM

## 2024-04-24 ENCOUNTER — Other Ambulatory Visit (HOSPITAL_BASED_OUTPATIENT_CLINIC_OR_DEPARTMENT_OTHER): Payer: Self-pay

## 2024-04-24 MED ORDER — ZEPBOUND 7.5 MG/0.5ML ~~LOC~~ SOAJ
7.5000 mg | SUBCUTANEOUS | 0 refills | Status: DC
  Filled 2024-04-24 – 2024-05-11 (×2): qty 2, 28d supply, fill #0

## 2024-05-04 ENCOUNTER — Other Ambulatory Visit (HOSPITAL_BASED_OUTPATIENT_CLINIC_OR_DEPARTMENT_OTHER): Payer: Self-pay

## 2024-05-11 ENCOUNTER — Other Ambulatory Visit (HOSPITAL_BASED_OUTPATIENT_CLINIC_OR_DEPARTMENT_OTHER): Payer: Self-pay

## 2024-06-19 ENCOUNTER — Ambulatory Visit: Payer: Self-pay | Admitting: Nurse Practitioner

## 2024-06-19 ENCOUNTER — Other Ambulatory Visit (HOSPITAL_BASED_OUTPATIENT_CLINIC_OR_DEPARTMENT_OTHER): Payer: Self-pay

## 2024-06-19 ENCOUNTER — Other Ambulatory Visit: Payer: Self-pay

## 2024-06-19 ENCOUNTER — Encounter: Payer: Self-pay | Admitting: Nurse Practitioner

## 2024-06-19 VITALS — BP 110/78 | HR 66 | Ht 65.75 in | Wt 154.0 lb

## 2024-06-19 DIAGNOSIS — Z79890 Hormone replacement therapy (postmenopausal): Secondary | ICD-10-CM | POA: Diagnosis not present

## 2024-06-19 DIAGNOSIS — Z1331 Encounter for screening for depression: Secondary | ICD-10-CM

## 2024-06-19 DIAGNOSIS — N644 Mastodynia: Secondary | ICD-10-CM | POA: Diagnosis not present

## 2024-06-19 DIAGNOSIS — Z01419 Encounter for gynecological examination (general) (routine) without abnormal findings: Secondary | ICD-10-CM | POA: Diagnosis not present

## 2024-06-19 MED ORDER — PROGESTERONE MICRONIZED 100 MG PO CAPS
100.0000 mg | ORAL_CAPSULE | Freq: Every day | ORAL | 3 refills | Status: AC
  Filled 2024-06-19: qty 90, 90d supply, fill #0

## 2024-06-19 MED ORDER — ESTRADIOL 0.0375 MG/24HR TD PTTW
1.0000 | MEDICATED_PATCH | TRANSDERMAL | 3 refills | Status: AC
  Filled 2024-06-19: qty 24, 84d supply, fill #0
  Filled 2024-09-08 – 2024-09-09 (×2): qty 24, 84d supply, fill #1

## 2024-06-19 NOTE — Progress Notes (Signed)
 Erin Salinas [birth year] [id number]   History:  63 y.o. [id number] presents for annual exam. Postmenopausal - HRT for management of generalized joint pain, fatigue, anxiety, insomnia.  Normal pap history. S/P 2013 TAH. Benign breast biopsy 03/2022. Reports having right breast pain after wearing new bra and noticed some blood on nipple. Pain is better. Down 70 pounds.   Gynecologic History No LMP recorded. Patient has had a hysterectomy.   Contraception/Family planning: status post hysterectomy Sexually active: Yes  Health Maintenance Last Pap: Prior to hysterectomy Last mammogram: 04/20/2024. Results were: Normal Last colonoscopy: 08/2023. Results were: Polyps, 5-year recall Last Dexa: Not indicated     06/19/2024    2:29 PM  Depression screen PHQ 2/9  Decreased Interest 0  Down, Depressed, Hopeless 0  PHQ - 2 Score 0     Past medical history, past surgical history, family history and social history were all reviewed and documented in the EPIC chart. 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   ROS:  A ROS was performed and pertinent positives and negatives are included.  Exam:  Vitals:   06/19/24 1420  BP: 110/78  Pulse: 66  SpO2: 99%  Weight: 154 lb (69.9 kg)  Height: 5' 5.75 (1.67 m)    Body mass index is 25.05 kg/m.  General appearance:  Normal Thyroid:  Symmetrical, normal in size, without palpable masses or nodularity. Respiratory  Auscultation:  Clear without wheezing or rhonchi Cardiovascular  Auscultation:  Regular rate, without rubs, murmurs or gallops  Edema/varicosities:  Not grossly evident Abdominal  Soft,nontender, without masses, guarding or rebound.  Liver/spleen:  No organomegaly noted  Hernia:  None appreciated  Skin  Inspection:  Grossly normal Breasts: Examined lying and sitting. Dense breast tissue.   Right: Without masses, retractions, nipple discharge or axillary adenopathy.   Left: Without masses,  retractions, nipple discharge or axillary adenopathy. Pelvic: External genitalia:  no lesions              Urethra:  normal appearing urethra with no masses, tenderness or lesions              Bartholins and Skenes: normal                 Vagina: normal appearing vagina with normal color and discharge, no lesions              Cervix: absent Bimanual Exam:  Uterus:  absent              Adnexa: no mass, fullness, tenderness              Rectovaginal: Deferred              Anus:  normal, no lesions  Zada Louder, CMA present as chaperone.   Assessment/Plan:  63 y.o. [id number] for annual exam.   Well female exam with routine gynecological exam - Education provided on SBEs, importance of preventative screenings, current guidelines, high calcium diet, regular exercise, and multivitamin daily. Labs with PCP. Congratulated on weight loss.   Postmenopausal hormone therapy - Plan: estradiol  (CLIMARA ) 0.0375 mg/24hr patch weekly, progesterone  (PROMETRIUM ) 100 MG capsule nightly. Good management.   Pain of right breast - Reports having right breast pain after wearing new bra and noticed some blood on nipple. Pain is better. Normal mammogram in July. Will monitor. If pain or nipple discharge return schedule imaging.   Screening for cervical cancer - Normal Pap history. No longer screening per guidelines.  Screening for breast cancer - Continue annual screenings.  Normal breast exam today.  Screening for colon cancer - 2024 colonoscopy. Will repeat at 5-year interval per GI's recommendation.   Screening for osteoporosis - Average risk. Will plan DXA at age 53.   Return in about 1 year (around 06/19/2025) for Annual.     Annabella DELENA Shutter DNP, 2:54 PM 06/19/2024

## 2024-06-20 ENCOUNTER — Other Ambulatory Visit: Payer: Self-pay

## 2024-09-09 ENCOUNTER — Other Ambulatory Visit (HOSPITAL_BASED_OUTPATIENT_CLINIC_OR_DEPARTMENT_OTHER): Payer: Self-pay

## 2024-09-12 ENCOUNTER — Other Ambulatory Visit (HOSPITAL_COMMUNITY): Payer: Self-pay

## 2024-10-02 ENCOUNTER — Other Ambulatory Visit (HOSPITAL_BASED_OUTPATIENT_CLINIC_OR_DEPARTMENT_OTHER): Payer: Self-pay

## 2024-10-02 MED ORDER — ZEPBOUND 2.5 MG/0.5ML ~~LOC~~ SOAJ
2.5000 mg | SUBCUTANEOUS | 1 refills | Status: AC
  Filled 2024-10-02: qty 2, 28d supply, fill #0

## 2024-10-17 ENCOUNTER — Other Ambulatory Visit (HOSPITAL_BASED_OUTPATIENT_CLINIC_OR_DEPARTMENT_OTHER): Payer: Self-pay
